# Patient Record
Sex: Female | Born: 1975 | Race: White | Hispanic: No | Marital: Married | Smoking: Never smoker
Health system: Southern US, Community
[De-identification: ages and names within clinical notes are randomized; demographics above are authoritative.]

## PROBLEM LIST (undated history)

## (undated) DIAGNOSIS — F429 Obsessive-compulsive disorder, unspecified: Secondary | ICD-10-CM

## (undated) DIAGNOSIS — R0602 Shortness of breath: Secondary | ICD-10-CM

## (undated) DIAGNOSIS — F32A Depression, unspecified: Secondary | ICD-10-CM

## (undated) DIAGNOSIS — M25471 Effusion, right ankle: Secondary | ICD-10-CM

## (undated) DIAGNOSIS — J1282 Pneumonia due to coronavirus disease 2019: Secondary | ICD-10-CM

## (undated) DIAGNOSIS — E559 Vitamin D deficiency, unspecified: Secondary | ICD-10-CM

## (undated) DIAGNOSIS — M25579 Pain in unspecified ankle and joints of unspecified foot: Secondary | ICD-10-CM

## (undated) DIAGNOSIS — F419 Anxiety disorder, unspecified: Secondary | ICD-10-CM

## (undated) DIAGNOSIS — R42 Dizziness and giddiness: Secondary | ICD-10-CM

## (undated) DIAGNOSIS — R5383 Other fatigue: Secondary | ICD-10-CM

## (undated) DIAGNOSIS — T79A0XA Compartment syndrome, unspecified, initial encounter: Secondary | ICD-10-CM

## (undated) DIAGNOSIS — U071 COVID-19: Secondary | ICD-10-CM

## (undated) DIAGNOSIS — K219 Gastro-esophageal reflux disease without esophagitis: Secondary | ICD-10-CM

## (undated) DIAGNOSIS — M25472 Effusion, left ankle: Secondary | ICD-10-CM

## (undated) DIAGNOSIS — M25569 Pain in unspecified knee: Secondary | ICD-10-CM

## (undated) DIAGNOSIS — I10 Essential (primary) hypertension: Secondary | ICD-10-CM

## (undated) DIAGNOSIS — M25475 Effusion, left foot: Secondary | ICD-10-CM

## (undated) HISTORY — DX: COVID-19: U07.1

## (undated) HISTORY — DX: Effusion, right ankle: M25.471

## (undated) HISTORY — DX: Dizziness and giddiness: R42

## (undated) HISTORY — DX: Other fatigue: R53.83

## (undated) HISTORY — DX: Effusion, left ankle: M25.472

## (undated) HISTORY — DX: Pain in unspecified knee: M25.569

## (undated) HISTORY — DX: Gastro-esophageal reflux disease without esophagitis: K21.9

## (undated) HISTORY — DX: Depression, unspecified: F32.A

## (undated) HISTORY — DX: Obsessive-compulsive disorder, unspecified: F42.9

## (undated) HISTORY — DX: Anxiety disorder, unspecified: F41.9

## (undated) HISTORY — DX: Shortness of breath: R06.02

## (undated) HISTORY — DX: Pneumonia due to coronavirus disease 2019: J12.82

## (undated) HISTORY — DX: Vitamin D deficiency, unspecified: E55.9

## (undated) HISTORY — DX: Essential (primary) hypertension: I10

## (undated) HISTORY — DX: Pain in unspecified ankle and joints of unspecified foot: M25.579

## (undated) HISTORY — DX: Compartment syndrome, unspecified, initial encounter: T79.A0XA

## (undated) HISTORY — DX: Effusion, left foot: M25.475

---

## 1997-04-12 ENCOUNTER — Inpatient Hospital Stay (HOSPITAL_COMMUNITY): Admission: AD | Admit: 1997-04-12 | Discharge: 1997-04-12 | Payer: Self-pay | Admitting: Gynecology

## 1997-04-24 ENCOUNTER — Inpatient Hospital Stay (HOSPITAL_COMMUNITY): Admission: AD | Admit: 1997-04-24 | Discharge: 1997-04-27 | Payer: Self-pay | Admitting: Gynecology

## 1997-06-04 ENCOUNTER — Other Ambulatory Visit: Admission: RE | Admit: 1997-06-04 | Discharge: 1997-06-04 | Payer: Self-pay | Admitting: Gynecology

## 1998-09-06 HISTORY — PX: DILATION AND CURETTAGE OF UTERUS: SHX78

## 1998-09-25 ENCOUNTER — Other Ambulatory Visit: Admission: RE | Admit: 1998-09-25 | Discharge: 1998-09-25 | Payer: Self-pay | Admitting: Obstetrics and Gynecology

## 1998-09-27 ENCOUNTER — Encounter (INDEPENDENT_AMBULATORY_CARE_PROVIDER_SITE_OTHER): Payer: Self-pay

## 1998-09-27 ENCOUNTER — Ambulatory Visit (HOSPITAL_COMMUNITY): Admission: RE | Admit: 1998-09-27 | Discharge: 1998-09-27 | Payer: Self-pay | Admitting: Obstetrics and Gynecology

## 2001-09-19 ENCOUNTER — Other Ambulatory Visit: Admission: RE | Admit: 2001-09-19 | Discharge: 2001-09-19 | Payer: Self-pay | Admitting: Obstetrics and Gynecology

## 2002-03-23 ENCOUNTER — Inpatient Hospital Stay (HOSPITAL_COMMUNITY): Admission: AD | Admit: 2002-03-23 | Discharge: 2002-03-26 | Payer: Self-pay | Admitting: *Deleted

## 2002-03-24 ENCOUNTER — Encounter (INDEPENDENT_AMBULATORY_CARE_PROVIDER_SITE_OTHER): Payer: Self-pay | Admitting: *Deleted

## 2010-02-05 ENCOUNTER — Encounter: Admission: RE | Admit: 2010-02-05 | Discharge: 2010-02-05 | Payer: Self-pay | Admitting: Obstetrics and Gynecology

## 2010-07-24 NOTE — Discharge Summary (Signed)
NAME:  [name], [name]                         ACCOUNT NO.:  [id number]   MEDICAL RECORD NO.:  [id number]                   PATIENT TYPE:  INP   LOCATION:  9108                                 FACILITY:  [facility]   PHYSICIAN:  Michelle L. Vincente Poli, M.D.            DATE OF BIRTH:  [birth year]   DATE OF ADMISSION:  03/24/2002  DATE OF DISCHARGE:  03/26/2002                                 DISCHARGE SUMMARY   ADMITTING DIAGNOSES:  1. Intrauterine pregnancy at term.  2. Previously scarred uterus, desires vaginal birth after cesarean.  3. Spontaneous rupture of membranes with blood stained fluid.   DISCHARGE DIAGNOSES:  1. Status post low transverse cesarean section.  2. Viable female infant.   PROCEDURE:  Repeat low transverse cesarean section.   REASON FOR ADMISSION:  Please see dictated H&P.   HOSPITAL COURSE:  The patient is a 35 year old white married female gravida  3, para 1 that was admitted to [facility] with spontaneous  rupture of membranes at 38 weeks estimated gestational age.  The patient had  a large amount of bright red bleeding associated with amniotic fluid.  The  patient had history of a previous cesarean section due to cephalopelvic  disproportion but patient desired and requested vaginal birth after  cesarean.  The fetal heart tones were reassuring.  However, she continued to  have multiple episodes of bright red bleeding.  Decision was made to proceed  with a cesarean delivery.  The patient was taken to the operating room where  spinal anesthesia was administered without difficulty.  A low transverse  incision was made with the delivery of a viable female infant weighing 6  pounds 8 ounces with Apgars of 8 at one minute, 9 at five minutes.  Small  placental abruption was noted.  The patient did tolerate the procedure well  and was taken to the recovery room in stable condition.  On postoperative  day one patient had good return of bowel function.  She was  ambulating  without assistance, tolerating a regular diet without complaints of nausea  and vomiting.  Laboratories revealed hemoglobin of 9.8, platelet count of  180,000, WBC count of 9.8.  On postoperative day two vital signs were  stable.  Abdomen was soft.  Abdominal dressing was removed revealing  incision that was clean, dry, and intact.  The patient desired early  discharge.  Instructions were given and she was discharged home.   CONDITION ON DISCHARGE:  Good.   DIET:  Regular, as tolerated.   ACTIVITY:  No heavy lifting.  No driving x2 weeks.  No vaginal entry.   FOLLOW UP:  She is to follow up in the office in three to four days for  staple removal.  Otherwise, she is to return back to the office in  approximately one week for an incision check.  She is to call for  temperature greater than 100 degrees,  persistent nausea and vomiting, heavy  vaginal bleeding, and/or redness or drainage from the incision site.   DISCHARGE MEDICATIONS:  1. Tylox numbers 30 one p.o. q.4-6h. p.r.n.  2.     Motrin 600 mg q.6h.  3. Prenatal vitamins one p.o. daily.  4. Colace one p.o. daily p.r.n.     Julio Sicks, N.P.                        Stann Mainland. Vincente Poli, M.D.    CC/MEDQ  D:  04/20/2002  T:  04/20/2002  Job:  [id number]

## 2010-07-24 NOTE — H&P (Signed)
   NAME:  [name], [name]                         ACCOUNT NO.:  [id number]   MEDICAL RECORD NO.:  [id number]                   PATIENT TYPE:  INP   LOCATION:  9169                                 FACILITY:  [facility]   PHYSICIAN:  Tracie Harrier, M.D.              DATE OF BIRTH:  05/30/1975   DATE OF ADMISSION:  03/23/2002  DATE OF DISCHARGE:                                HISTORY & PHYSICAL   HISTORY OF PRESENT ILLNESS:  Ms. Wickersham is a 35 year old female, Gravida III,  Para I, Ab 1 at [redacted] weeks gestation, admitted after spontaneous rupture of  membranes at approximately midnight. This spontaneous rupture of membranes  was associated with a large amount of blood. Fetal heart tones were  reassuring upon admission. She had a previous C-section in 1999 for fetal  macrosomia and requested a V-back. Her pregnancy otherwise went well. The  patient has a history of positive group B strep vaginal colonization.   OB LABORATORY DATA:  Maternal blood type A+, Rubella immune, positive group  B strep, Glucola normal.   PAST MEDICAL HISTORY:  1. History of poisonous snake bite.  2. History of hypertension, no current medications.   PAST SURGICAL HISTORY:  1. D&E in 2000.  2. C-section in 1999.   OBSTETRICAL HISTORY:  1. In 1999 a female weighing 8 pounds and 3 ounces for presumed macrosomia.  2. In 2000 SAB with D&E.   CURRENT MEDICATIONS:  Prenatal vitamins.   ALLERGIES:  No known drug allergies.   PHYSICAL EXAMINATION:  VITAL SIGNS: Stable. Blood pressure initially  elevated at 150/100, repeat 140/80, temperature 99.7. Fetal heart tones  140's to 150's and reassuring. No decelerations noted.  GENERAL: A well developed, well nourished  female in no acute distress.  HEENT: Within normal limits.  NECK: Supple without adenopathy or thyromegaly.  HEART: Clear.  LUNGS: Clear.  BREAST: Examination deferred.  ABDOMEN: Gravid and nontender.  EXTREMITIES: Normal.  NEURO: Grossly normal.  PELVIC:  Examination closed, thick, and high with vertex presentation.  Positive blood amniotic fluid is noted (nurse examiner).   ADMISSION DIAGNOSES:  1. Intrauterine pregnancy at 38 weeks.  2. Spontaneous rupture of membranes/bloody fluid.  3. Plans vaginal birth after Cesarean.  4. Positive group B strep vaginal colonization.   PLAN:  1. Admit.  2. Close observation.  3. Vaginal birth after Cesarean attempt.                                                 Tracie Harrier, M.D.    REG/MEDQ  D:  03/24/2002  T:  03/24/2002  Job:  [id number]

## 2010-07-24 NOTE — Op Note (Signed)
[name], [name]                         ACCOUNT NO.:  [id number]   MEDICAL RECORD NO.:  [id number]                   PATIENT TYPE:  INP   LOCATION:  9169                                 FACILITY:  [facility]   PHYSICIAN:  Tracie Harrier, M.D.              DATE OF BIRTH:  08/26/1975   DATE OF PROCEDURE:  03/24/2002  DATE OF DISCHARGE:                                 OPERATIVE REPORT   PREOPERATIVE DIAGNOSES:  1. Intrauterine pregnancy at term.  2. Repeat cesarean section.  3. Vaginal bleeding.   POSTOPERATIVE DIAGNOSES:  1. Intrauterine pregnancy at term.  2. Repeat cesarean section.  3. Vaginal bleeding.  4. Small placental abruption.   PROCEDURE:  Repeat low transverse cesarean section.   SURGEON:  Tracie Harrier, M.D.   ANESTHESIA:  Spinal.   ESTIMATED BLOOD LOSS:  800 cubic centimeters.   COMPLICATIONS:  None.   FINDINGS:  At 11 through a low transverse uterine incision, a viable female  infant was delivered from the vertex presentation.  Apgars were 8 and 9.   A small placental abruption was noted.   The pelvis was visualized at the time of surgery and noted to be normal.   INDICATIONS:  The patient is a 35 year old female who experienced  spontaneous rupture of membranes at 38 weeks at about midnight last night.  She had a large amount of bright blood associated with amniotic fluid.  She  requested VBAC, however.  She was admitted to labor and delivery, where she  was watched closely.  The patient did have multiple episodes of bright red  bleeding.  Fetal heart tones remained reassuring.  After thoughtful  consideration, the patient was taken for repeat cesarean section for  unexplained vaginal bleeding.  The risks, benefits, and need for this  surgery were explained to the patient.  Questions were answered.   DESCRIPTION OF PROCEDURE:  The patient was taken to the operating room,  where a spinal anesthetic was administered.  The patient was placed on  the  operating table in the left lateral tilt position, the abdomen was prepped  and draped in the usual sterile fashion with Betadine and sterile drapes.  A  Foley catheter was inserted.  The abdomen was entered through a Pfannenstiel  incision and carried down sharply in the usual fashion.  The peritoneum was  atraumatically entered.  The vesicouterine peritoneum overlying the lower  uterine segment was incised and the bladder flap was bluntly and sharply  created over the lower uterine segment.  A bladder blade was then placed  behind the bladder to ensure its protection during the procedure.  The  uterus was then entered through a low transverse incision and carried out  laterally using the operator's fingers.  The vertex was elevated into the  incision and delivered promptly and easily at 0659.  The oropharynx and  nasopharynx were thoroughly bulb-suctioned.  A nuchal  cord was reduced x1.  The vertex delivered easily.  The oropharynx and nasopharynx were bulb-  suctioned and the baby handed promptly to the pediatricians.  The baby did  well with strong, spontaneous cry.  Apgars were 8 and 9.  Weight is pending.   A small placental abruption was then noted.  Cord blood was obtained.  The  placenta was then manually extracted intact with three-vessel cord without  difficulty.  The anterior of the uterus was wiped clean thoroughly with a  wet sponge.  The uterine incision was then closed in a two-layer fashion,  the first layer a running interlocking suture of #1 Vicryl.  A second  imbricating suture was placed across the primary suture line with a running  suture of #1 Vicryl as well.  Good hemostasis was noted.  The pelvis was  thoroughly irrigated and noted to be hemostatic.  The pelvis was also noted  to be normal.   Hemostasis was noted and attention was then turned to closure.  The rectus  muscle and anterior peritoneum were closed with a running suture of #1  Vicryl.  The  subfascial areas were hemostatic. The fascia was then closed  with a running suture of #1 PDS.  The subcutaneous tissue was irrigated and  made hemostatic using the Bovie cautery.  The subcutaneous tissue was  reapproximated with multiple interrupted sutures of #1 Vicryl.  The skin  reapproximated with staples and a sterile dressing applied.   Final sponge, needle, and instrument count was correct x3.  There were no  perioperative complications.  The baby did well.  The patient did receive an  antibiotic postoperatively.                                               Tracie Harrier, M.D.    REG/MEDQ  D:  03/24/2002  T:  03/25/2002  Job:  [id number]

## 2018-04-19 ENCOUNTER — Other Ambulatory Visit: Payer: Self-pay | Admitting: Obstetrics and Gynecology

## 2018-04-19 DIAGNOSIS — R928 Other abnormal and inconclusive findings on diagnostic imaging of breast: Secondary | ICD-10-CM

## 2018-04-26 ENCOUNTER — Ambulatory Visit
Admission: RE | Admit: 2018-04-26 | Discharge: 2018-04-26 | Disposition: A | Discharge status: Home / Self Care | Payer: 59 | Source: Ambulatory Visit | Attending: Obstetrics and Gynecology | Admitting: Obstetrics and Gynecology

## 2018-04-26 ENCOUNTER — Ambulatory Visit: Payer: Self-pay

## 2018-04-26 DIAGNOSIS — R928 Other abnormal and inconclusive findings on diagnostic imaging of breast: Secondary | ICD-10-CM

## 2018-07-26 DIAGNOSIS — I10 Essential (primary) hypertension: Secondary | ICD-10-CM | POA: Insufficient documentation

## 2018-07-26 DIAGNOSIS — E559 Vitamin D deficiency, unspecified: Secondary | ICD-10-CM | POA: Insufficient documentation

## 2018-07-26 DIAGNOSIS — F422 Mixed obsessional thoughts and acts: Secondary | ICD-10-CM | POA: Insufficient documentation

## 2018-07-26 DIAGNOSIS — F419 Anxiety disorder, unspecified: Secondary | ICD-10-CM | POA: Insufficient documentation

## 2018-08-09 ENCOUNTER — Encounter: Payer: Self-pay | Admitting: Internal Medicine

## 2020-03-10 DIAGNOSIS — R7989 Other specified abnormal findings of blood chemistry: Secondary | ICD-10-CM | POA: Insufficient documentation

## 2020-03-25 DIAGNOSIS — B372 Candidiasis of skin and nail: Secondary | ICD-10-CM | POA: Insufficient documentation

## 2020-07-09 IMAGING — MG DIGITAL DIAGNOSTIC UNILATERAL LEFT MAMMOGRAM WITH TOMO AND CAD
8 series · 8 of 24 positions shown · non-contrast
Comparison: Previous exam(s).

CLINICAL DATA: Screening recall for a left breast asymmetry.

EXAM:
DIGITAL DIAGNOSTIC UNILATERAL LEFT MAMMOGRAM WITH CAD AND TOMO

[L CC synth-2D (1 of 2)]
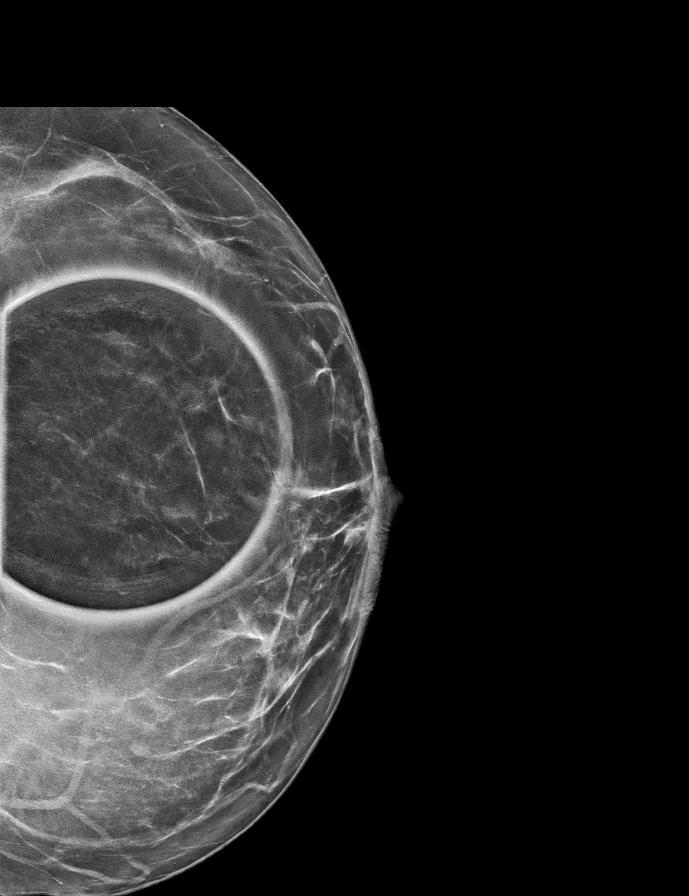

[L ML synth-2D]
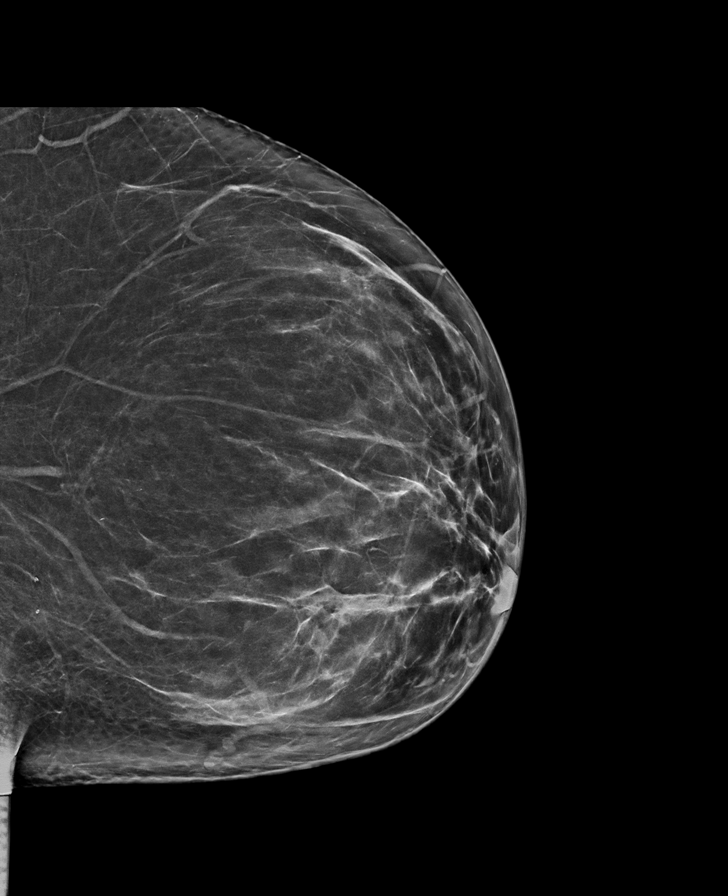

[L MLO synth-2D]
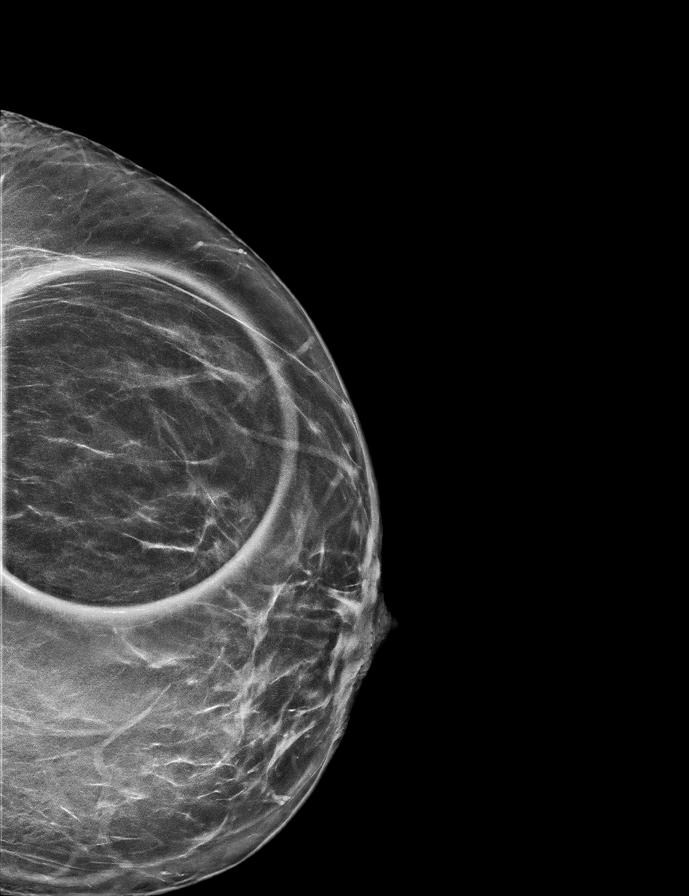

[L CC synth-2D (2 of 2)]
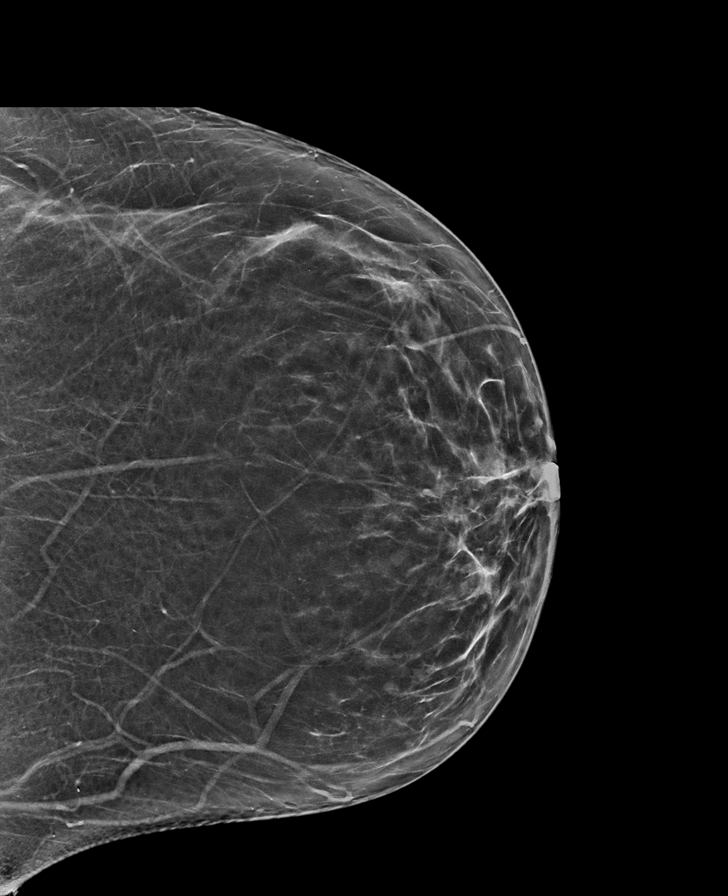

[L CC tomo (1 of 2) · tomo slice 38/75.0]
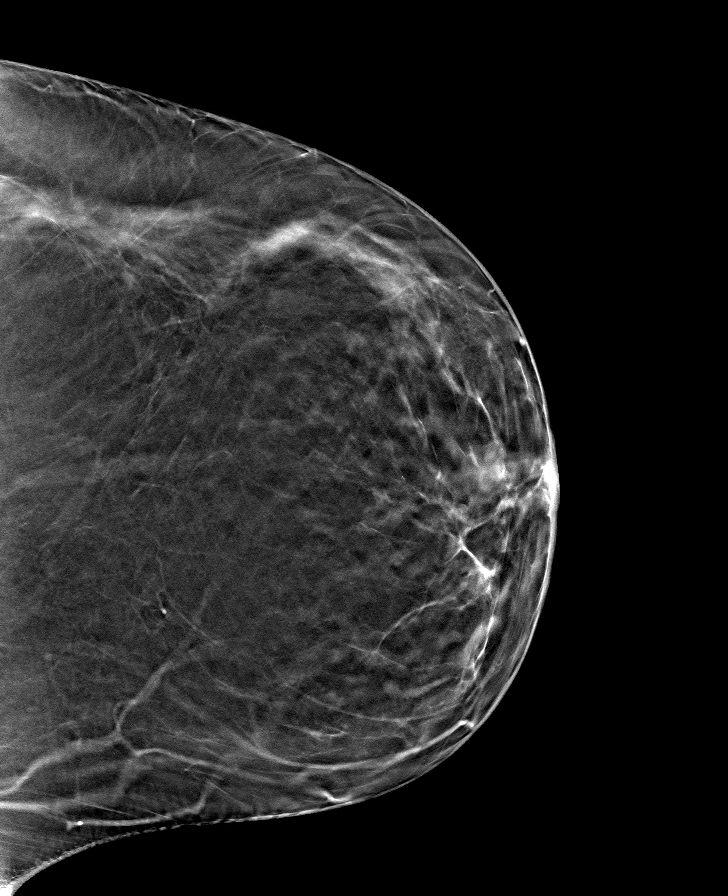

[L CC tomo (2 of 2) · tomo slice 31/62.0]
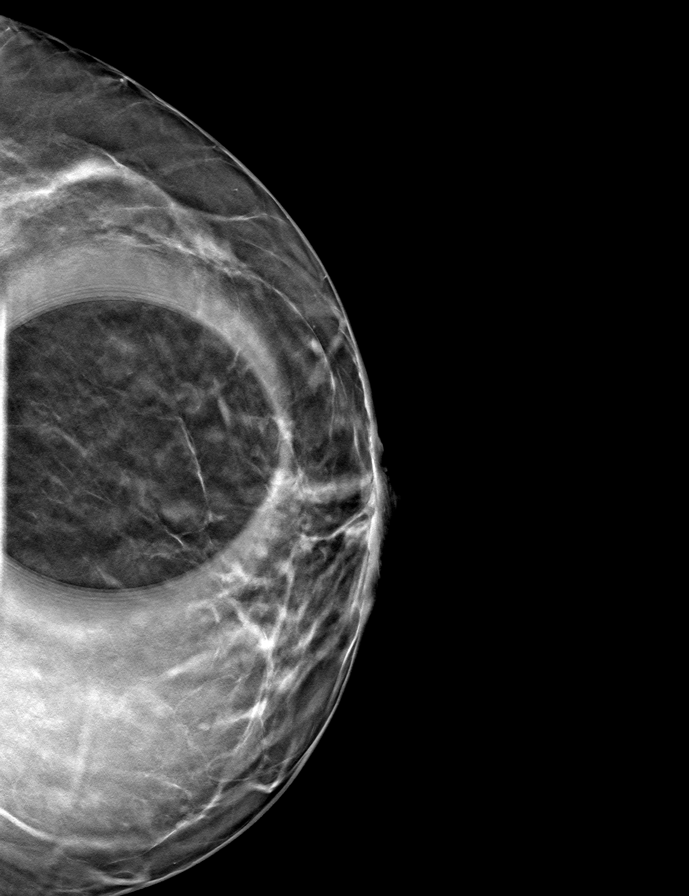

[L MLO tomo · tomo slice 37/74.0]
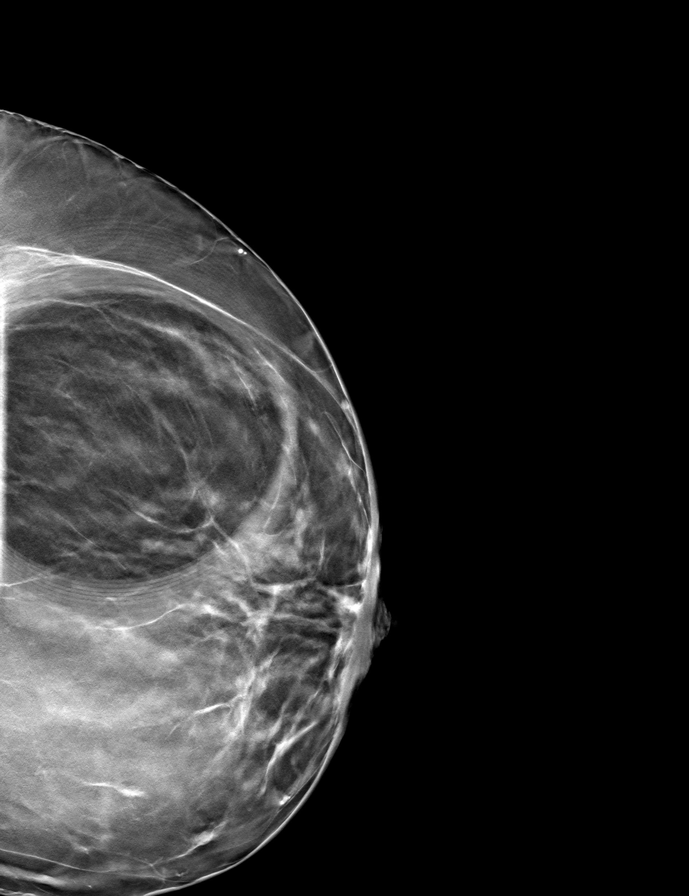

[L ML tomo · tomo slice 37/74.0]
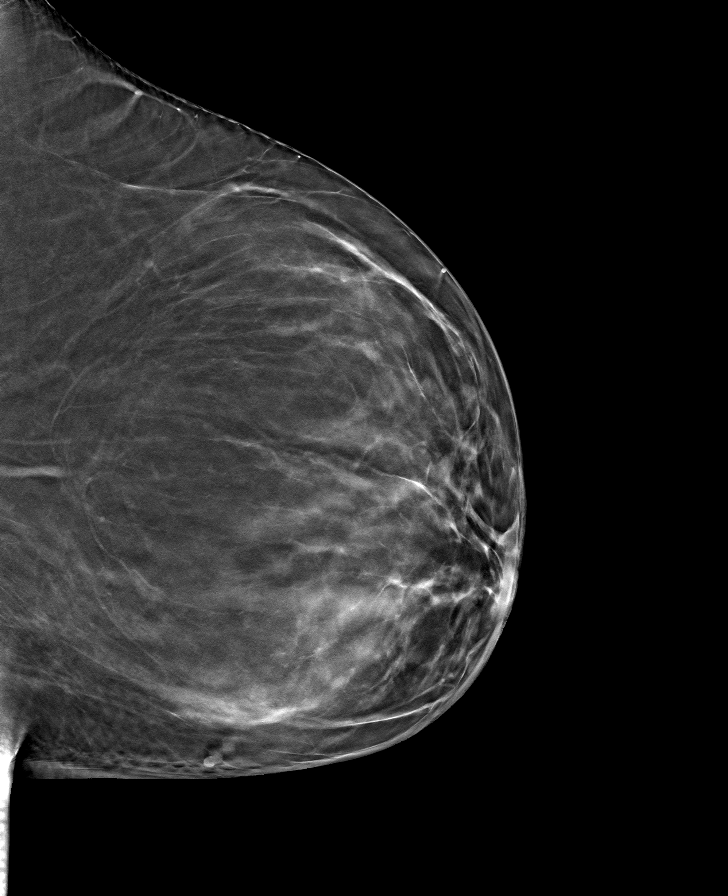

[8 of 24 positions shown; findings below may reference images not displayed]

ACR Breast Density Category c: The breast tissue is heterogeneously
dense, which may obscure small masses.
FINDINGS: The small asymmetry in the lateral aspect of the left breast
resolves on spot compression tomosynthesis imaging, as well as a
repeat CC view. No suspicious calcifications, masses or areas of
distortion are seen in the left breast.

Mammographic images were processed with CAD.
IMPRESSION: Resolution of the left breast asymmetry consistent with overlapping
fibroglandular tissue.

RECOMMENDATION:
Screening mammogram in one year.(Code:Q8-5-39M)

I have discussed the findings and recommendations with the patient.
Results were also provided in writing at the conclusion of the
visit. If applicable, a reminder letter will be sent to the patient
regarding the next appointment.

BI-RADS CATEGORY  1: Negative.

## 2020-12-03 LAB — BASIC METABOLIC PANEL
BUN: 17 (ref 4–21)
CO2: 27 — AB (ref 13–22)
Chloride: 106 (ref 99–108)
Creatinine: 0.8 (ref <=1.1)
Glucose: 86
Potassium: 5.2 (ref 3.4–5.3)
Sodium: 144 (ref 137–147)

## 2020-12-03 LAB — HEPATIC FUNCTION PANEL
Alkaline Phosphatase: 95 (ref 25–125)
Bilirubin, Total: 0.2

## 2020-12-03 LAB — CBC AND DIFFERENTIAL
HCT: 44 (ref 36–46)
Hemoglobin: 14.6 (ref 12.0–16.0)
Platelets: 281 (ref 150–399)
WBC: 7.6

## 2020-12-03 LAB — COMPREHENSIVE METABOLIC PANEL: Albumin: 4.3 (ref 3.5–5.0)

## 2020-12-03 LAB — HEMOGLOBIN A1C: Hemoglobin A1C: 5.7

## 2020-12-03 LAB — CBC: RBC: 5.42 — AB (ref 3.87–5.11)

## 2020-12-03 LAB — LIPID PANEL
Cholesterol: 218 — AB (ref 0–200)
HDL: 67 (ref 35–70)
LDL Cholesterol: 136
LDl/HDL Ratio: 2

## 2020-12-03 LAB — VITAMIN D 25 HYDROXY (VIT D DEFICIENCY, FRACTURES): Vit D, 25-Hydroxy: 28.9

## 2020-12-03 LAB — TSH: TSH: 1.51 (ref <=5.90)

## 2020-12-16 DIAGNOSIS — Z0289 Encounter for other administrative examinations: Secondary | ICD-10-CM

## 2020-12-18 ENCOUNTER — Ambulatory Visit (INDEPENDENT_AMBULATORY_CARE_PROVIDER_SITE_OTHER): Payer: 59 | Admitting: Bariatrics

## 2021-01-01 ENCOUNTER — Ambulatory Visit (INDEPENDENT_AMBULATORY_CARE_PROVIDER_SITE_OTHER): Payer: Self-pay | Admitting: Bariatrics

## 2021-01-13 ENCOUNTER — Encounter (INDEPENDENT_AMBULATORY_CARE_PROVIDER_SITE_OTHER): Payer: Self-pay | Admitting: Family Medicine

## 2021-01-13 ENCOUNTER — Other Ambulatory Visit: Payer: Self-pay

## 2021-01-13 ENCOUNTER — Ambulatory Visit (INDEPENDENT_AMBULATORY_CARE_PROVIDER_SITE_OTHER): Payer: 59 | Admitting: Family Medicine

## 2021-01-13 VITALS — BP 137/86 | HR 85 | Temp 98.2°F | Ht 64.0 in | Wt 304.0 lb

## 2021-01-13 DIAGNOSIS — Z1331 Encounter for screening for depression: Secondary | ICD-10-CM

## 2021-01-13 DIAGNOSIS — R0602 Shortness of breath: Secondary | ICD-10-CM

## 2021-01-13 DIAGNOSIS — I10 Essential (primary) hypertension: Secondary | ICD-10-CM

## 2021-01-13 DIAGNOSIS — Z9189 Other specified personal risk factors, not elsewhere classified: Secondary | ICD-10-CM

## 2021-01-13 DIAGNOSIS — Z6841 Body Mass Index (BMI) 40.0 and over, adult: Secondary | ICD-10-CM

## 2021-01-13 DIAGNOSIS — E559 Vitamin D deficiency, unspecified: Secondary | ICD-10-CM | POA: Diagnosis not present

## 2021-01-13 DIAGNOSIS — F39 Unspecified mood [affective] disorder: Secondary | ICD-10-CM

## 2021-01-13 DIAGNOSIS — R5383 Other fatigue: Secondary | ICD-10-CM | POA: Diagnosis not present

## 2021-01-14 LAB — COMPREHENSIVE METABOLIC PANEL
ALT: 19 IU/L (ref 0–32)
AST: 18 IU/L (ref 0–40)
Albumin/Globulin Ratio: 1.4 (ref 1.2–2.2)
Albumin: 4.3 g/dL (ref 3.8–4.8)
Alkaline Phosphatase: 115 IU/L (ref 44–121)
BUN/Creatinine Ratio: 19 (ref 9–23)
BUN: 15 mg/dL (ref 6–24)
Bilirubin Total: 0.3 mg/dL (ref 0.0–1.2)
CO2: 16 mmol/L — ABNORMAL LOW (ref 20–29)
Calcium: 9.1 mg/dL (ref 8.7–10.2)
Chloride: 101 mmol/L (ref 96–106)
Creatinine, Ser: 0.79 mg/dL (ref 0.57–1.00)
Globulin, Total: 3.1 g/dL (ref 1.5–4.5)
Glucose: 74 mg/dL (ref 70–99)
Potassium: 4.6 mmol/L (ref 3.5–5.2)
Sodium: 138 mmol/L (ref 134–144)
Total Protein: 7.4 g/dL (ref 6.0–8.5)
eGFR: 93 mL/min/{1.73_m2} (ref >59)

## 2021-01-14 LAB — CBC WITH DIFFERENTIAL/PLATELET
Basophils Absolute: 0.1 10*3/uL (ref 0.0–0.2)
Basos: 1 %
EOS (ABSOLUTE): 0.1 10*3/uL (ref 0.0–0.4)
Eos: 1 %
Hematocrit: 44.5 % (ref 34.0–46.6)
Hemoglobin: 14.4 g/dL (ref 11.1–15.9)
Immature Grans (Abs): 0 10*3/uL (ref 0.0–0.1)
Immature Granulocytes: 0 %
Lymphocytes Absolute: 2.7 10*3/uL (ref 0.7–3.1)
Lymphs: 23 %
MCH: 26.6 pg (ref 26.6–33.0)
MCHC: 32.4 g/dL (ref 31.5–35.7)
MCV: 82 fL (ref 79–97)
Monocytes Absolute: 0.6 10*3/uL (ref 0.1–0.9)
Monocytes: 5 %
Neutrophils Absolute: 8.4 10*3/uL — ABNORMAL HIGH (ref 1.4–7.0)
Neutrophils: 70 %
Platelets: 339 10*3/uL (ref 150–450)
RBC: 5.42 x10E6/uL — ABNORMAL HIGH (ref 3.77–5.28)
RDW: 13.9 % (ref 11.7–15.4)
WBC: 12 10*3/uL — ABNORMAL HIGH (ref 3.4–10.8)

## 2021-01-14 LAB — LIPID PANEL WITH LDL/HDL RATIO
Cholesterol, Total: 206 mg/dL — ABNORMAL HIGH (ref 100–199)
HDL: 59 mg/dL (ref >39)
LDL Chol Calc (NIH): 134 mg/dL — ABNORMAL HIGH (ref 0–99)
LDL/HDL Ratio: 2.3 ratio (ref 0.0–3.2)
Triglycerides: 72 mg/dL (ref 0–149)
VLDL Cholesterol Cal: 13 mg/dL (ref 5–40)

## 2021-01-14 LAB — T4, FREE: Free T4: 1.28 ng/dL (ref 0.82–1.77)

## 2021-01-14 LAB — FOLATE: Folate: 6.4 ng/mL (ref >3.0)

## 2021-01-14 LAB — INSULIN, RANDOM: INSULIN: 43.9 u[IU]/mL — ABNORMAL HIGH (ref 2.6–24.9)

## 2021-01-14 LAB — TSH: TSH: 1.54 u[IU]/mL (ref 0.450–4.500)

## 2021-01-14 LAB — HEMOGLOBIN A1C
Est. average glucose Bld gHb Est-mCnc: 114 mg/dL
Hgb A1c MFr Bld: 5.6 % (ref 4.8–5.6)

## 2021-01-14 LAB — VITAMIN B12: Vitamin B-12: 538 pg/mL (ref 232–1245)

## 2021-01-14 LAB — VITAMIN D 25 HYDROXY (VIT D DEFICIENCY, FRACTURES): Vit D, 25-Hydroxy: 25.8 ng/mL — ABNORMAL LOW (ref 30.0–100.0)

## 2021-01-14 NOTE — Progress Notes (Signed)
Chief Complaint:   OBESITY [name] (MR# [id number]) is a 45 y.o. female who presents for evaluation and treatment of obesity and related comorbidities. Current BMI is Body mass index is 52.18 kg/m. [name] has been struggling with her weight for many years and has been unsuccessful in either losing weight, maintaining weight loss, or reaching her healthy weight goal.  [name] lives with her husband and 2 sons, ages 83 and 64 years old. 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 She craves pasta, carbohydrates, "junk food", and chocolate. She skips breakfast every day, and she notes her worst habit is "junk food". She drinks caloric beverages (2 cans of sodas per day on an average).  [name] is currently in the action stage of change and ready to dedicate time achieving and maintaining a healthier weight. [name] is interested in becoming our patient and working on intensive lifestyle modifications including (but not limited to) diet and exercise for weight loss.  [name]'s habits were reviewed today and are as follows: Her family eats meals together, she thinks her family will eat healthier with her, her desired weight loss is 154 lbs, she has been heavy most of her life, she started gaining weight , as she has always been heavy, her heaviest weight ever was 300 pounds, she is a picky eater and doesn't like to eat healthier foods, she has significant food cravings issues, she skips meals frequently, she is frequently drinking liquids with calories, she frequently makes poor food choices, she frequently eats larger portions than normal, she has binge eating behaviors, and she struggles with emotional eating.  Depression Screen [name]'s Food and Mood (modified PHQ-9) score was 20.  Depression screen PHQ 2/9 01/13/2021  Decreased Interest 3  Down, Depressed, Hopeless 3  PHQ - 2 Score 6  Altered sleeping 3  Tired, decreased energy 3  Change in appetite 3  Feeling bad  or failure about yourself  3  Trouble concentrating 2  Moving slowly or fidgety/restless 0  Suicidal thoughts 0  PHQ-9 Score 20  Difficult doing work/chores Somewhat difficult   Subjective:   1. Other fatigue [name] admits to daytime somnolence and admits to waking up still tired. Patent has a history of symptoms of daytime fatigue and morning fatigue. [name] generally gets 8 hours of sleep per night, and states that she has nightime awakenings. Snoring is present. Apneic episodes are present. Epworth Sleepiness Score is 3. 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 She has been short of breath, fatigued, and dizzy since.  2. Shortness of breath on exertion [name] notes increasing shortness of breath with exercising and seems to be worsening over time with weight gain. She notes getting out of breath sooner with activity than she used to. This has not gotten worse recently. [name] denies shortness of breath at rest or orthopnea.  3. Essential hypertension [name] is taking Lotrel. She denies dizziness, headache, or vision changes. She has no issues with her medications or blood pressure.   4. Vitamin D deficiency [name] was on OTC Vit D 5,000 units q daily, but she is not currently taking this. She was recently told that she is low in Vit D.  5. Mood disorder (HCC) with emotional eating [name] has anxiety and OCD. She is taking Zoloft. Her PHQ-9 score is 20. She has a seen a counselor in the past for OCD, but has not seen anyone recently. She denies depression, although her PHQ-9 score  is high.   6. At risk for diabetes mellitus [name] is at higher than average risk for developing diabetes due to obesity.   Assessment/Plan:   Orders Placed This Encounter  Procedures   Vitamin B12   CBC with Differential/Platelet   Comprehensive metabolic panel   Folate   Hemoglobin A1c   Insulin, random   Lipid Panel With LDL/HDL Ratio   T4, free   TSH    VITAMIN D 25 Hydroxy (Vit-D Deficiency, Fractures)   EKG 12-Lead    There are no discontinued medications.   No orders of the defined types were placed in this encounter.    1. Other fatigue [name] does feel that her weight is causing her energy to be lower than it should be. Fatigue may be related to obesity, depression or many other causes. Labs will be ordered, and in the meanwhile, Latrish will focus on self care including making healthy food choices, increasing physical activity and focusing on stress reduction.  - Vitamin B12 - CBC with Differential/Platelet - EKG 12-Lead - Folate - Hemoglobin A1c - Insulin, random - T4, free - TSH  2. Shortness of breath on exertion Minna does feel that she gets out of breath more easily that she used to when she exercises. [place]'s shortness of breath appears to be obesity related and exercise induced. She has agreed to work on weight loss and gradually increase exercise to treat her exercise induced shortness of breath. Will continue to monitor closely.  3. Essential hypertension We will check labs today. [name]'s blood pressure is not optimally controlled. Her primary care physician recently told her to change to Norvasc 10 mg and Valsartan 160 mg, and discontinue Lotrel, and I recommend that as well. [name] is to continue with at home blood pressure monitoring.   - CBC with Differential/Platelet - Hemoglobin A1c - Insulin, random - Lipid Panel With LDL/HDL Ratio - T4, free - TSH - Comprehensive metabolic panel  4. Vitamin D deficiency Low Vitamin D level contributes to fatigue and are associated with obesity, breast, and colon cancer. We will check labs today and [name] will follow-up for routine testing of Vitamin D, at least 2-3 times per year to avoid over-replacement.  - VITAMIN D 25 Hydroxy (Vit-D Deficiency, Fractures)  5. Mood disorder (HCC) with emotional eating Behavior modification techniques were discussed today  to help [name] deal with her emotional/non-hunger eating behaviors. We will refer [name] to Dr. Dewaine Conger, our Bariatric Psychologist for evaluation. She will continue her medications for OCD per her primary care physician. She understands that Dr. Dewaine Conger may refer her to an outside counselor as well for OCD. Orders and follow up as documented in patient record.   6. Screening for depression Shirl had a positive depression screening. Depression is commonly associated with obesity and often results in emotional eating behaviors. We will monitor this closely and work on CBT to help improve the non-hunger eating patterns. Referral to Psychology may be required if no improvement is seen as she continues in our clinic.  7. At risk for diabetes mellitus Jimma was given approximately 18 minutes of diabetes education and counseling today. We discussed intensive lifestyle modifications today with an emphasis on weight loss as well as increasing exercise and decreasing simple carbohydrates in her diet. We also reviewed medication options with an emphasis on risk versus benefit of those discussed.   Repetitive spaced learning was employed today to elicit superior memory formation and behavioral change.  8. Class 3 severe obesity with  serious comorbidity and body mass index (BMI) of 50.0 to 59.9 in adult, unspecified obesity type (HCC) [name] is currently in the action stage of change and her goal is to continue with weight loss efforts. I recommend [name] begin the structured treatment plan as follows:  She has agreed to the Category 3 Plan.  [name] is hesitant about the plan, and she will focus on eating 1 meals per day on the plan.  Exercise goals: As is.   Behavioral modification strategies: decreasing liquid calories and avoiding temptations.  She was informed of the importance of frequent follow-up visits to maximize her success with intensive lifestyle modifications for her multiple health  conditions. She was informed we would discuss her lab results at her next visit unless there is a critical issue that needs to be addressed sooner. [name] agreed to keep her next visit at the agreed upon time to discuss these results.  Objective:   Blood pressure 137/86, pulse 85, temperature 98.2 F (36.8 C), height 5\' 4"  (1.626 m), weight (!) 304 lb (137.9 kg), SpO2 96 %. Body mass index is 52.18 kg/m.  EKG: Normal sinus rhythm, rate 80 BPM.  Indirect Calorimeter completed today shows a VO2 of 364 and a REE of 2520.  Her calculated basal metabolic rate is thus her basal metabolic rate is better than expected.  General: Cooperative, alert, well developed, in no acute distress. HEENT: Conjunctivae and lids unremarkable. Cardiovascular: Regular rhythm.  Lungs: Normal work of breathing. Neurologic: No focal deficits.   No results found for: CREATININE, BUN, NA, K, CL, CO2 No results found for: ALT, AST, GGT, ALKPHOS, BILITOT No results found for: HGBA1C No results found for: INSULIN No results found for: TSH No results found for: CHOL, HDL, LDLCALC, LDLDIRECT, TRIG, CHOLHDL No results found for: WBC, HGB, HCT, MCV, PLT No results found for: IRON, TIBC, FERRITIN  Attestation Statements:   Reviewed by clinician on day of visit: allergies, medications, problem list, medical history, surgical history, family history, social history, and previous encounter notes.   [id number], am acting as transcriptionist for Trude Mcburney, DO.  I have reviewed the above documentation for accuracy and completeness, and I agree with the above. [employer], D.O.  The 21st Century Cures Act was signed into law in 2016 which includes the topic of electronic health records.  This provides immediate access to information in MyChart.  This includes consultation notes, operative notes, office notes, lab results and pathology reports.  If you have any questions about what you read please  let 2017 know at your next visit so we can discuss your concerns and take corrective action if need be.  We are right here with you.

## 2021-01-15 ENCOUNTER — Ambulatory Visit (INDEPENDENT_AMBULATORY_CARE_PROVIDER_SITE_OTHER): Payer: Self-pay | Admitting: Family Medicine

## 2021-01-15 ENCOUNTER — Encounter (INDEPENDENT_AMBULATORY_CARE_PROVIDER_SITE_OTHER): Payer: Self-pay | Admitting: Family Medicine

## 2021-01-20 ENCOUNTER — Encounter (INDEPENDENT_AMBULATORY_CARE_PROVIDER_SITE_OTHER): Payer: Self-pay

## 2021-01-27 ENCOUNTER — Encounter (INDEPENDENT_AMBULATORY_CARE_PROVIDER_SITE_OTHER): Payer: Self-pay | Admitting: Bariatrics

## 2021-01-27 ENCOUNTER — Other Ambulatory Visit: Payer: Self-pay

## 2021-01-27 ENCOUNTER — Ambulatory Visit (INDEPENDENT_AMBULATORY_CARE_PROVIDER_SITE_OTHER): Payer: 59 | Admitting: Bariatrics

## 2021-01-27 VITALS — BP 135/86 | HR 104 | Temp 98.1°F | Ht 64.0 in | Wt 302.0 lb

## 2021-01-27 DIAGNOSIS — I10 Essential (primary) hypertension: Secondary | ICD-10-CM | POA: Diagnosis not present

## 2021-01-27 DIAGNOSIS — F5089 Other specified eating disorder: Secondary | ICD-10-CM | POA: Diagnosis not present

## 2021-01-27 DIAGNOSIS — E559 Vitamin D deficiency, unspecified: Secondary | ICD-10-CM

## 2021-01-27 DIAGNOSIS — E8881 Metabolic syndrome: Secondary | ICD-10-CM

## 2021-01-27 MED ORDER — BUPROPION HCL ER (SR) 150 MG PO TB12: 150.0000 mg | ORAL_TABLET | Freq: Every day | ORAL | 0 refills | Status: DC

## 2021-01-27 MED ORDER — VITAMIN D (ERGOCALCIFEROL) 1.25 MG (50000 UNIT) PO CAPS: 50000.0000 [IU] | ORAL_CAPSULE | ORAL | 0 refills | Status: DC

## 2021-01-27 NOTE — Progress Notes (Addendum)
Chief Complaint:   OBESITY [name] is here to discuss her progress with her obesity treatment plan along with follow-up of her obesity related diagnoses. [name] is on the Category 3 Plan and states she is following her eating plan approximately 30% of the time. [name] states she is doing 0 minutes 0 times per week.  Today's visit was #: 2 Starting weight: 304 lbs Starting date: 01/13/2021 Today's weight: 302 lbs Today's date: 01/27/2021 Total lbs lost to date: 2 lbs Total lbs lost since last in-office visit: 2 lbs  Interim History: [name] is down 2 lbs for her first visit. She did  not do well with the plan.  Subjective:   1. Vitamin D insufficiency [name] is currently not on medications. Her last Vitamin D level was 28.8.  2. Insulin resistance [name] is currently not on medications. Her last Insulin level 43.9. Her last A1C was 5.6.  3. Essential hypertension [name] was started on Lotrel at last visit. Her last blood pressure was 135/86.  4. Other disorder of eating [name] is struggling with stress emotional eating and using food for comfort to the extent that it is negatively impacting her health.    Assessment/Plan:   1. Vitamin D insufficiency Low Vitamin D level contributes to fatigue and are associated with obesity, breast, and colon cancer. We will refill prescription Vitamin D 50,000 IU every week for 1 month with no refills and Johnathon will follow-up for routine testing of Vitamin D, at least 2-3 times per year to avoid over-replacement.  - Vitamin D, Ergocalciferol, (DRISDOL) 1.25 MG (50000 UNIT) CAPS capsule; Take 1 capsule (50,000 Units total) by mouth every 7 (seven) days.  Dispense: 4 capsule; Refill: 0  2. Insulin resistance [name] will continue to work on weight loss, exercise, and decreasing simple carbohydrates to help decrease the risk of diabetes. She will increase healthy protein and fats. Handout on Insulin and Pre-diabetes were  provided.  3. Essential hypertension [name] will continue her blood pressure medication. She is working on healthy weight loss and exercise to improve blood pressure control. We will watch for signs of hypotension as she continues her lifestyle modifications.  4. Other disorder of eating Behavior modification techniques were discussed today to help [name] deal with her emotional/non-hunger eating behaviors.  We will refill bupropion 150 mg for 1 month with no refills. Orders and follow up as documented in patient record.    - buPROPion (WELLBUTRIN SR) 150 MG 12 hr tablet; Take 1 tablet (150 mg total) by mouth daily.  Dispense: 30 tablet; Refill: 0  [name] is currently in the action stage of change. As such, her goal is to continue with weight loss efforts. She has agreed to keeping a food journal and adhering to recommended goals of 1500 calories and 90 grams of protein.   [name] will continue meal planning. We reviewed labs from 01/13/2021 CMP, Lipid, Vitamin, CBC Insulin,TSH and T4. Strategies for the holidays were provided.  Exercise goals: No exercise has been prescribed at this time.  Behavioral modification strategies: increasing lean protein intake, decreasing simple carbohydrates, increasing vegetables, increasing water intake, decreasing eating out, no skipping meals, meal planning and cooking strategies, keeping healthy foods in the home, and planning for success.  [name] has agreed to follow-up with our clinic in 2 weeks with Dr. Sharee Holster. She was informed of the importance of frequent follow-up visits to maximize her success with intensive lifestyle modifications for her multiple health conditions.   Objective:   Blood  pressure 135/86, pulse (!) 104, temperature 98.1 F (36.7 C), height 5\' 4"  (1.626 m), weight (!) 302 lb (137 kg), SpO2 96 %. Body mass index is 51.84 kg/m.  General: Cooperative, alert, well developed, in no acute distress. HEENT: Conjunctivae and lids  unremarkable. Cardiovascular: Regular rhythm.  Lungs: Normal work of breathing. Neurologic: No focal deficits.   Lab Results  Component Value Date   CREATININE 0.79 01/13/2021   BUN 15 01/13/2021   NA 138 01/13/2021   K 4.6 01/13/2021   CL 101 01/13/2021   CO2 16 (L) 01/13/2021   Lab Results  Component Value Date   ALT 19 01/13/2021   AST 18 01/13/2021   ALKPHOS 115 01/13/2021   BILITOT 0.3 01/13/2021   Lab Results  Component Value Date   HGBA1C 5.6 01/13/2021   HGBA1C 5.7 12/03/2020   Lab Results  Component Value Date   INSULIN 43.9 (H) 01/13/2021   Lab Results  Component Value Date   TSH 1.540 01/13/2021   Lab Results  Component Value Date   CHOL 206 (H) 01/13/2021   HDL 59 01/13/2021   LDLCALC 134 (H) 01/13/2021   TRIG 72 01/13/2021   Lab Results  Component Value Date   VD25OH 25.8 (L) 01/13/2021   VD25OH 28.9 12/03/2020   Lab Results  Component Value Date   WBC 12.0 (H) 01/13/2021   HGB 14.4 01/13/2021   HCT 44.5 01/13/2021   MCV 82 01/13/2021   PLT 339 01/13/2021   No results found for: IRON, TIBC, FERRITIN  Attestation Statements:   Reviewed by clinician on day of visit: allergies, medications, problem list, medical history, surgical history, family history, social history, and previous encounter notes.  I, 13/10/2020, RMA, am acting as [employer] for Energy manager, DO.  [employer], DO

## 2021-02-02 ENCOUNTER — Encounter (INDEPENDENT_AMBULATORY_CARE_PROVIDER_SITE_OTHER): Payer: Self-pay | Admitting: Bariatrics

## 2021-02-10 ENCOUNTER — Ambulatory Visit (INDEPENDENT_AMBULATORY_CARE_PROVIDER_SITE_OTHER): Payer: Self-pay | Admitting: Family Medicine

## 2021-02-11 ENCOUNTER — Ambulatory Visit (INDEPENDENT_AMBULATORY_CARE_PROVIDER_SITE_OTHER): Payer: 59 | Admitting: Family Medicine

## 2021-02-11 ENCOUNTER — Encounter (INDEPENDENT_AMBULATORY_CARE_PROVIDER_SITE_OTHER): Payer: Self-pay | Admitting: Family Medicine

## 2021-02-11 ENCOUNTER — Other Ambulatory Visit: Payer: Self-pay

## 2021-02-11 VITALS — BP 137/84 | HR 93 | Temp 98.3°F | Ht 64.0 in | Wt 304.0 lb

## 2021-02-11 DIAGNOSIS — E7800 Pure hypercholesterolemia, unspecified: Secondary | ICD-10-CM

## 2021-02-11 DIAGNOSIS — R7301 Impaired fasting glucose: Secondary | ICD-10-CM

## 2021-02-11 DIAGNOSIS — I10 Essential (primary) hypertension: Secondary | ICD-10-CM | POA: Diagnosis not present

## 2021-02-11 DIAGNOSIS — E559 Vitamin D deficiency, unspecified: Secondary | ICD-10-CM | POA: Diagnosis not present

## 2021-02-11 DIAGNOSIS — F5089 Other specified eating disorder: Secondary | ICD-10-CM

## 2021-02-11 DIAGNOSIS — Z6841 Body Mass Index (BMI) 40.0 and over, adult: Secondary | ICD-10-CM

## 2021-02-11 MED ORDER — VICTOZA 18 MG/3ML ~~LOC~~ SOPN: PEN_INJECTOR | SUBCUTANEOUS | 1 refills | Status: DC

## 2021-02-11 MED ORDER — BUPROPION HCL ER (SR) 150 MG PO TB12: 150.0000 mg | ORAL_TABLET | Freq: Every day | ORAL | 0 refills | Status: DC

## 2021-02-11 MED ORDER — VITAMIN D (ERGOCALCIFEROL) 1.25 MG (50000 UNIT) PO CAPS: 50000.0000 [IU] | ORAL_CAPSULE | ORAL | 0 refills | Status: DC

## 2021-02-11 NOTE — Progress Notes (Signed)
Chief Complaint:   OBESITY [name] is here to discuss her progress with her obesity treatment plan along with follow-up of her obesity related diagnoses. See Medical Weight Management Flowsheet for complete bioelectrical impedance results.  Today's visit was #: 3 Starting weight: 304 lbs Starting date: 01/13/2021 Weight change since last visit: +2 lbs Total lbs lost to date: 0  Nutrition Plan: Keeping a food journal and adhering to recommended goals of 1500 calories and 90 grams of protein daily for 30% of the time. Activity: None.  Interim History:  Reviewed labs, first 2 visits.  High hunger cues.  Ozempic not covered by [facility].  Assessment/Plan:   1. Impaired fasting glucose Start Victoza 0.6 mg subcutaneously daily for 7 days, then increase to 1.2 mg subcutaneously daily, as per below.  - Start liraglutide (VICTOZA) 18 MG/3ML SOPN; Start 0.6mg  SQ once a day for 7 days, then increase to 1.2mg  once a day  Dispense: 6 mL; Refill: 1  2. Vitamin D insufficiency Not at goal. She is taking vitamin D 50,000 IU weekly.  Plan: Continue to take prescription Vitamin D @50 ,000 IU every week as prescribed.  Follow-up for routine testing of Vitamin D, at least 2-3 times per year to avoid over-replacement.  Lab Results  Component Value Date   VD25OH 25.8 (L) 01/13/2021   VD25OH 28.9 12/03/2020   - Refill Vitamin D, Ergocalciferol, (DRISDOL) 1.25 MG (50000 UNIT) CAPS capsule; Take 1 capsule (50,000 Units total) by mouth every 7 (seven) days.  Dispense: 4 capsule; Refill: 0  3. Pure hypercholesterolemia Course: Not at goal. Lipid-lowering medications: None.   Plan: Dietary changes: Increase soluble fiber, decrease simple carbohydrates, decrease saturated fat. Exercise changes: Moderate to vigorous-intensity aerobic activity 150 minutes per week or as tolerated. We will continue to monitor along with PCP/specialists as it pertains to her weight loss journey.  Lab Results  Component  Value Date   CHOL 206 (H) 01/13/2021   HDL 59 01/13/2021   LDLCALC 134 (H) 01/13/2021   TRIG 72 01/13/2021   Lab Results  Component Value Date   ALT 19 01/13/2021   AST 18 01/13/2021   ALKPHOS 115 01/13/2021   BILITOT 0.3 01/13/2021   The 10-year ASCVD risk score (Arnett DK, et al., 2019) is: 1.4%   Values used to calculate the score:     Age: 45 years     Sex: Female     Is Non-Hispanic African American: No     Diabetic: No     Tobacco smoker: No     Systolic Blood Pressure: 137 mmHg     Is BP treated: Yes     HDL Cholesterol: 59 mg/dL     Total Cholesterol: 206 mg/dL  4. Essential hypertension At goal. Medications: Norvasc 10 mg daily, valsartan 80 mg daily.   Plan: Avoid buying foods that are: processed, frozen, or prepackaged to avoid excess salt. We will watch for signs of hypotension as she continues lifestyle modifications.  BP Readings from Last 3 Encounters:  02/11/21 137/84  01/27/21 135/86  01/13/21 137/86   Lab Results  Component Value Date   CREATININE 0.79 01/13/2021   5. Other disorder of eating Behavior modification techniques were discussed today to help [name] deal with her emotional/non-hunger eating behaviors.  She is taking Wellbutrin 150 mg daily.  Will refill today, as per below.  - Refill buPROPion (WELLBUTRIN SR) 150 MG 12 hr tablet; Take 1 tablet (150 mg total) by mouth daily.  Dispense: 30  tablet; Refill: 0  6. Obesity, current BMI 52.2  Course: [name] is currently in the action stage of change. As such, her goal is to continue with weight loss efforts.   Nutrition goals: She has agreed to keeping a food journal and adhering to recommended goals of 1500 calories and 90 grams of protein.   Exercise goals: No exercise has been prescribed at this time.  Behavioral modification strategies: increasing lean protein intake, decreasing simple carbohydrates, and increasing vegetables.  [name] has agreed to follow-up with our clinic in 4  weeks. She was informed of the importance of frequent follow-up visits to maximize her success with intensive lifestyle modifications for her multiple health conditions.   Objective:   Blood pressure 137/84, pulse 93, temperature 98.3 F (36.8 C), temperature source Oral, height 5\' 4"  (1.626 m), weight (!) 304 lb (137.9 kg), SpO2 96 %. Body mass index is 52.18 kg/m.  General: Cooperative, alert, well developed, in no acute distress. HEENT: Conjunctivae and lids unremarkable. Cardiovascular: Regular rhythm.  Lungs: Normal work of breathing. Neurologic: No focal deficits.   Lab Results  Component Value Date   CREATININE 0.79 01/13/2021   BUN 15 01/13/2021   NA 138 01/13/2021   K 4.6 01/13/2021   CL 101 01/13/2021   CO2 16 (L) 01/13/2021   Lab Results  Component Value Date   ALT 19 01/13/2021   AST 18 01/13/2021   ALKPHOS 115 01/13/2021   BILITOT 0.3 01/13/2021   Lab Results  Component Value Date   HGBA1C 5.6 01/13/2021   HGBA1C 5.7 12/03/2020   Lab Results  Component Value Date   INSULIN 43.9 (H) 01/13/2021   Lab Results  Component Value Date   TSH 1.540 01/13/2021   Lab Results  Component Value Date   CHOL 206 (H) 01/13/2021   HDL 59 01/13/2021   LDLCALC 134 (H) 01/13/2021   TRIG 72 01/13/2021   Lab Results  Component Value Date   VD25OH 25.8 (L) 01/13/2021   VD25OH 28.9 12/03/2020   Lab Results  Component Value Date   WBC 12.0 (H) 01/13/2021   HGB 14.4 01/13/2021   HCT 44.5 01/13/2021   MCV 82 01/13/2021   PLT 339 01/13/2021   Attestation Statements:   Reviewed by clinician on day of visit: allergies, medications, problem list, medical history, surgical history, family history, social history, and previous encounter notes.  I, 13/10/2020, CMA, am acting as transcriptionist for Insurance claims handler, DO  I have reviewed the above documentation for accuracy and completeness, and I agree with the above. -  Helane Rima, DO, MS, FAAFP, DABOM - Family and  Bariatric Medicine.

## 2021-02-16 ENCOUNTER — Encounter (INDEPENDENT_AMBULATORY_CARE_PROVIDER_SITE_OTHER): Payer: Self-pay

## 2021-02-16 ENCOUNTER — Telehealth (INDEPENDENT_AMBULATORY_CARE_PROVIDER_SITE_OTHER): Payer: Self-pay | Admitting: Family Medicine

## 2021-02-16 NOTE — Telephone Encounter (Signed)
Prior authorization denied for Victoza, patient does not have type 2 diabetes. Patient sent mychart message.

## 2021-02-22 ENCOUNTER — Encounter (INDEPENDENT_AMBULATORY_CARE_PROVIDER_SITE_OTHER): Payer: Self-pay | Admitting: Family Medicine

## 2021-02-22 DIAGNOSIS — F39 Unspecified mood [affective] disorder: Secondary | ICD-10-CM

## 2021-03-12 ENCOUNTER — Encounter (INDEPENDENT_AMBULATORY_CARE_PROVIDER_SITE_OTHER): Payer: Self-pay | Admitting: Family Medicine

## 2021-03-12 MED ORDER — TOPIRAMATE ER 25 MG PO CAP24: 25.0000 mg | ORAL_CAPSULE | Freq: Every day | ORAL | 0 refills | Status: DC

## 2021-03-18 ENCOUNTER — Telehealth (INDEPENDENT_AMBULATORY_CARE_PROVIDER_SITE_OTHER): Payer: Self-pay

## 2021-03-18 ENCOUNTER — Encounter (INDEPENDENT_AMBULATORY_CARE_PROVIDER_SITE_OTHER): Payer: Self-pay

## 2021-03-18 NOTE — Telephone Encounter (Signed)
The request for coverage for Topiramate Cap Er 25mg , use as directed (30 per month), is denied. This decision is based on health plan criteria for Topiramate Er. This medicine is covered only if: All of the following: (1) Your doctor submits medical records documenting that you have had an inadequate response, failed, or cannot use one of the following (date and duration of trial must be provided): (A) Topiramate. (B) Topamax. (2) Your doctor provides an explanation for how the requested medication will work for you if you have failed on the covered therapeutically equivalent product which has the same active ingredient (for example, you had an adverse reaction to an inactive ingredient in the alternative product). The information provided does not show that you meet the criteria listed above.

## 2021-03-26 ENCOUNTER — Ambulatory Visit (INDEPENDENT_AMBULATORY_CARE_PROVIDER_SITE_OTHER): Payer: 59 | Admitting: Family Medicine

## 2021-03-26 ENCOUNTER — Encounter (INDEPENDENT_AMBULATORY_CARE_PROVIDER_SITE_OTHER): Payer: Self-pay

## 2021-03-30 ENCOUNTER — Encounter (INDEPENDENT_AMBULATORY_CARE_PROVIDER_SITE_OTHER): Payer: Self-pay

## 2021-03-31 ENCOUNTER — Encounter (INDEPENDENT_AMBULATORY_CARE_PROVIDER_SITE_OTHER): Payer: Self-pay

## 2021-04-22 ENCOUNTER — Encounter (INDEPENDENT_AMBULATORY_CARE_PROVIDER_SITE_OTHER): Payer: Self-pay | Admitting: Family Medicine

## 2021-04-22 ENCOUNTER — Ambulatory Visit (INDEPENDENT_AMBULATORY_CARE_PROVIDER_SITE_OTHER): Payer: 59 | Admitting: Family Medicine

## 2021-04-22 ENCOUNTER — Other Ambulatory Visit: Payer: Self-pay

## 2021-04-22 VITALS — BP 134/87 | HR 81 | Temp 98.0°F | Ht 64.0 in | Wt 304.0 lb

## 2021-04-22 DIAGNOSIS — E559 Vitamin D deficiency, unspecified: Secondary | ICD-10-CM | POA: Diagnosis not present

## 2021-04-22 DIAGNOSIS — F39 Unspecified mood [affective] disorder: Secondary | ICD-10-CM

## 2021-04-22 DIAGNOSIS — F5089 Other specified eating disorder: Secondary | ICD-10-CM

## 2021-04-22 DIAGNOSIS — Z6841 Body Mass Index (BMI) 40.0 and over, adult: Secondary | ICD-10-CM

## 2021-04-22 DIAGNOSIS — I10 Essential (primary) hypertension: Secondary | ICD-10-CM | POA: Diagnosis not present

## 2021-04-22 DIAGNOSIS — E669 Obesity, unspecified: Secondary | ICD-10-CM

## 2021-04-23 MED ORDER — TOPIRAMATE ER 25 MG PO CAP24: 25.0000 mg | ORAL_CAPSULE | Freq: Every day | ORAL | 0 refills | Status: DC

## 2021-04-23 MED ORDER — PHENTERMINE HCL 37.5 MG PO TABS: ORAL_TABLET | ORAL | 0 refills | Status: DC

## 2021-04-23 MED ORDER — BUPROPION HCL ER (SR) 150 MG PO TB12: 150.0000 mg | ORAL_TABLET | Freq: Every day | ORAL | 1 refills | Status: DC

## 2021-04-23 NOTE — Progress Notes (Signed)
Chief Complaint:   OBESITY [name] is here to discuss her progress with her obesity treatment plan along with follow-up of her obesity related diagnoses. See Medical Weight Management Flowsheet for complete bioelectrical impedance results.  Today's visit was #: 4 Starting weight: 304 lbs Starting date: 02/11/2021 Weight change since last visit: 0 Total lbs lost to date: 0  Nutrition Plan: Keeping a food journal and adhering to recommended goals of 1500 calories and 90 grams of protein daily for 30-40% of the time. Activity: None. Anti-obesity medications: Ozempic 0.25 mg subcutaneously weekly. Reported side effects: None.  Interim History: [name] has had 3 doses of Ozempic 0.25 mg subcutaneously weekly.  She says she is willing to pay for [facility] out of pocket if necessary.  Assessment/Plan:   1. Essential hypertension At goal. Medications: Norvasc 10 mg daily, valsartan 80 mg twice daily.   Plan: Avoid buying foods that are: processed, frozen, or prepackaged to avoid excess salt. We will watch for signs of hypotension as she continues lifestyle modifications.  BP Readings from Last 3 Encounters:  04/22/21 134/87  02/11/21 137/84  01/27/21 135/86   Lab Results  Component Value Date   CREATININE 0.79 01/13/2021   2. Vitamin D insufficiency Not at goal. She is taking vitamin D 50,000 IU weekly.  Plan: Continue to take prescription Vitamin D @50 ,000 IU every week as prescribed.  Follow-up for routine testing of Vitamin D, at least 2-3 times per year to avoid over-replacement.  Lab Results  Component Value Date   VD25OH 25.8 (L) 01/13/2021   VD25OH 28.9 12/03/2020   3. Other disorder of eating [name] is taking Wellbutrin 150 mg daily and Trokendi 25 mg daily.  Plan:  Continue Wellbutrin and Trokendi.  Will refill both today, as per below.  Start phentermine 18.75 mg daily.  - Refill buPROPion (WELLBUTRIN SR) 150 MG 12 hr tablet; Take 1 tablet (150 mg total) by  mouth daily.  Dispense: 90 tablet; Refill: 1 - Start phentermine (ADIPEX-P) 37.5 MG tablet; 1/2 tab po q am  Dispense: 15 tablet; Refill: 0 - Refill Topiramate ER (TROKENDI XR) 25 MG CP24; Take 1 capsule (25 mg total) by mouth daily.  Dispense: 30 capsule; Refill: 0  We reviewed potential side effects including insomnia, dry mouth, increased heart rate and blood pressure, and increased anxiety. We reviewed reducing caffeine consumption while taking phentermine, especially if the patient is experiencing side effects. Alternative treatment options were discussed. All questions were answered, and the patient wishes to move forward with this medication.  I have consulted the [place] Controlled Substances Registry for this patient, and feel the risk/benefit ratio today is favorable for proceeding with this prescription for a controlled substance. The patient understands monitoring parameters and red flags.   4. Mood disorder (HCC) [name] has anxiety and OCD. She is taking Zoloft 150 mg daily.  Plan:  Continue Zoloft 150 mg daily.   5. Obesity, current BMI 52.2  Course: [name] is currently in the action stage of change. As such, her goal is to continue with weight loss efforts.   Nutrition goals: She has agreed to keeping a food journal and adhering to recommended goals of 1500 calories and 90 grams of protein.   Exercise goals: For substantial health benefits, adults should do at least 150 minutes (2 hours and 30 minutes) a week of moderate-intensity, or 75 minutes (1 hour and 15 minutes) a week of vigorous-intensity aerobic physical activity, or an equivalent combination of moderate- and vigorous-intensity  aerobic activity. Aerobic activity should be performed in episodes of at least 10 minutes, and preferably, it should be spread throughout the week.  Behavioral modification strategies: increasing lean protein intake, decreasing simple carbohydrates, increasing vegetables, increasing water intake, and  decreasing liquid calories.  [name] has agreed to follow-up with our clinic in 4 weeks. She was informed of the importance of frequent follow-up visits to maximize her success with intensive lifestyle modifications for her multiple health conditions.   Objective:   Blood pressure 134/87, pulse 81, temperature 98 F (36.7 C), temperature source Oral, height 5\' 4"  (1.626 m), weight (!) 304 lb (137.9 kg), SpO2 95 %. Body mass index is 52.18 kg/m.  General: Cooperative, alert, well developed, in no acute distress. HEENT: Conjunctivae and lids unremarkable. Cardiovascular: Regular rhythm.  Lungs: Normal work of breathing. Neurologic: No focal deficits.   Lab Results  Component Value Date   CREATININE 0.79 01/13/2021   BUN 15 01/13/2021   NA 138 01/13/2021   K 4.6 01/13/2021   CL 101 01/13/2021   CO2 16 (L) 01/13/2021   Lab Results  Component Value Date   ALT 19 01/13/2021   AST 18 01/13/2021   ALKPHOS 115 01/13/2021   BILITOT 0.3 01/13/2021   Lab Results  Component Value Date   HGBA1C 5.6 01/13/2021   HGBA1C 5.7 12/03/2020   Lab Results  Component Value Date   INSULIN 43.9 (H) 01/13/2021   Lab Results  Component Value Date   TSH 1.540 01/13/2021   Lab Results  Component Value Date   CHOL 206 (H) 01/13/2021   HDL 59 01/13/2021   LDLCALC 134 (H) 01/13/2021   TRIG 72 01/13/2021   Lab Results  Component Value Date   VD25OH 25.8 (L) 01/13/2021   VD25OH 28.9 12/03/2020   Lab Results  Component Value Date   WBC 12.0 (H) 01/13/2021   HGB 14.4 01/13/2021   HCT 44.5 01/13/2021   MCV 82 01/13/2021   PLT 339 01/13/2021   Attestation Statements:   Reviewed by clinician on day of visit: allergies, medications, problem list, medical history, surgical history, family history, social history, and previous encounter notes.  I, 13/10/2020, CMA, am acting as transcriptionist for Insurance claims handler, DO  I have reviewed the above documentation for accuracy and completeness,  and I agree with the above. -  Helane Rima, DO, MS, FAAFP, DABOM - Family and Bariatric Medicine.

## 2021-04-28 ENCOUNTER — Encounter (INDEPENDENT_AMBULATORY_CARE_PROVIDER_SITE_OTHER): Payer: Self-pay

## 2021-04-28 DIAGNOSIS — R7301 Impaired fasting glucose: Secondary | ICD-10-CM

## 2021-04-28 MED ORDER — TIRZEPATIDE 5 MG/0.5ML ~~LOC~~ SOAJ: 5.0000 mg | SUBCUTANEOUS | 0 refills | Status: DC

## 2021-04-29 ENCOUNTER — Encounter (INDEPENDENT_AMBULATORY_CARE_PROVIDER_SITE_OTHER): Payer: Self-pay

## 2021-05-27 ENCOUNTER — Other Ambulatory Visit: Payer: Self-pay

## 2021-05-27 ENCOUNTER — Encounter (INDEPENDENT_AMBULATORY_CARE_PROVIDER_SITE_OTHER): Payer: Self-pay | Admitting: Family Medicine

## 2021-05-27 ENCOUNTER — Ambulatory Visit (INDEPENDENT_AMBULATORY_CARE_PROVIDER_SITE_OTHER): Payer: 59 | Admitting: Family Medicine

## 2021-05-27 VITALS — BP 113/74 | HR 81 | Temp 98.2°F | Ht 64.0 in | Wt 301.0 lb

## 2021-05-27 DIAGNOSIS — R7301 Impaired fasting glucose: Secondary | ICD-10-CM | POA: Diagnosis not present

## 2021-05-27 DIAGNOSIS — F5089 Other specified eating disorder: Secondary | ICD-10-CM | POA: Diagnosis not present

## 2021-05-27 DIAGNOSIS — I10 Essential (primary) hypertension: Secondary | ICD-10-CM | POA: Diagnosis not present

## 2021-05-27 DIAGNOSIS — E669 Obesity, unspecified: Secondary | ICD-10-CM

## 2021-05-27 DIAGNOSIS — Z6841 Body Mass Index (BMI) 40.0 and over, adult: Secondary | ICD-10-CM

## 2021-05-27 MED ORDER — TIRZEPATIDE 7.5 MG/0.5ML ~~LOC~~ SOAJ: 7.5000 mg | SUBCUTANEOUS | 1 refills | Status: DC

## 2021-05-27 MED ORDER — TIRZEPATIDE 5 MG/0.5ML ~~LOC~~ SOAJ: 5.0000 mg | SUBCUTANEOUS | 1 refills | Status: DC

## 2021-05-27 MED ORDER — TIRZEPATIDE 10 MG/0.5ML ~~LOC~~ SOAJ: 10.0000 mg | SUBCUTANEOUS | 1 refills | Status: AC

## 2021-05-28 ENCOUNTER — Encounter (INDEPENDENT_AMBULATORY_CARE_PROVIDER_SITE_OTHER): Payer: Self-pay

## 2021-05-28 ENCOUNTER — Encounter (INDEPENDENT_AMBULATORY_CARE_PROVIDER_SITE_OTHER): Payer: Self-pay | Admitting: Family Medicine

## 2021-06-05 NOTE — Progress Notes (Signed)
Chief Complaint:   OBESITY [name] is here to discuss her progress with her obesity treatment plan along with follow-up of her obesity related diagnoses. See Medical Weight Management Flowsheet for complete bioelectrical impedance results.  Today's visit was #: 5 Starting weight: 304 lbs Starting date: 02/11/2021 Weight change since last visit: 3 lbs Total lbs lost to date: 3 lbs Total weight loss percentage to date: 0.99%  Nutrition Plan: Keeping a food journal and adhering to recommended goals of 1500  calories and 90 grams of protein daily for 60% of the time. Activity: Increased walking. Anti-obesity medications: Mounjaro 5 mg subcutaneously weekly. Reported side effects: None.  Interim History: [name] says she was able to get [facility].  She did not pick up Wellbutrin, phentermine, or Trokendi.  Assessment/Plan:   1. Impaired fasting glucose [name] is currently on Mounjaro 5 mg subcutaneously weekly.  Plan:  Will refill Mounjaro 5 mg and also send in Mounjaro 7.5 mg and 10 mg for step therapy.  - Refill tirzepatide (MOUNJARO) 5 MG/0.5ML Pen; Inject 5 mg into the skin once a week.  Dispense: 2 mL; Refill: 1 - tirzepatide (MOUNJARO) 7.5 MG/0.5ML Pen; Inject 7.5 mg into the skin once a week.  Dispense: 2 mL; Refill: 1 - tirzepatide (MOUNJARO) 10 MG/0.5ML Pen; Inject 10 mg into the skin once a week.  Dispense: 2 mL; Refill: 1  2. Essential hypertension At goal. Medications: Norvasc 10 mg daily, valsartan 80 mg twice daily.    Plan: Avoid buying foods that are: processed, frozen, or prepackaged to avoid excess salt. We will watch for signs of hypotension as she continues lifestyle modifications  BP Readings from Last 3 Encounters:  05/27/21 113/74  04/22/21 134/87  02/11/21 137/84   Lab Results  Component Value Date   CREATININE 0.79 01/13/2021   3. Emotional eating tendencies Uncontrolled.  Medication: Mounjaro.  Plan: Discussed cues and consequences, how  thoughts affect eating, model of thoughts, feelings, and behaviors, and strategies for change by focusing on the cue. Discussed cognitive distortions, coping thoughts, and how to change your thoughts.  4. Obesity, current BMI 51.7  Course: [name] is currently in the action stage of change. As such, her goal is to continue with weight loss efforts.   Nutrition goals: She has agreed to keeping a food journal and adhering to recommended goals of 1500 calories and 90 grams of protein.   Exercise goals:  As is.  Behavioral modification strategies: increasing lean protein intake, decreasing simple carbohydrates, increasing vegetables, and increasing water intake.  [name] has agreed to follow-up with our clinic in 4 weeks. She was informed of the importance of frequent follow-up visits to maximize her success with intensive lifestyle modifications for her multiple health conditions.   Objective:   Blood pressure 113/74, pulse 81, temperature 98.2 F (36.8 C), temperature source Oral, height 5\' 4"  (1.626 m), weight (!) 301 lb (136.5 kg), SpO2 97 %. Body mass index is 51.67 kg/m.  General: Cooperative, alert, well developed, in no acute distress. HEENT: Conjunctivae and lids unremarkable. Cardiovascular: Regular rhythm.  Lungs: Normal work of breathing. Neurologic: No focal deficits.   Lab Results  Component Value Date   CREATININE 0.79 01/13/2021   BUN 15 01/13/2021   NA 138 01/13/2021   K 4.6 01/13/2021   CL 101 01/13/2021   CO2 16 (L) 01/13/2021   Lab Results  Component Value Date   ALT 19 01/13/2021   AST 18 01/13/2021   ALKPHOS 115 01/13/2021  BILITOT 0.3 01/13/2021   Lab Results  Component Value Date   HGBA1C 5.6 01/13/2021   HGBA1C 5.7 12/03/2020   Lab Results  Component Value Date   INSULIN 43.9 (H) 01/13/2021   Lab Results  Component Value Date   TSH 1.540 01/13/2021   Lab Results  Component Value Date   CHOL 206 (H) 01/13/2021   HDL 59 01/13/2021    LDLCALC 134 (H) 01/13/2021   TRIG 72 01/13/2021   Lab Results  Component Value Date   VD25OH 25.8 (L) 01/13/2021   VD25OH 28.9 12/03/2020   Lab Results  Component Value Date   WBC 12.0 (H) 01/13/2021   HGB 14.4 01/13/2021   HCT 44.5 01/13/2021   MCV 82 01/13/2021   PLT 339 01/13/2021   Attestation Statements:   Reviewed by clinician on day of visit: allergies, medications, problem list, medical history, surgical history, family history, social history, and previous encounter notes.  I, Insurance claims handler, CMA, am acting as transcriptionist for Helane Rima, DO  I have reviewed the above documentation for accuracy and completeness, and I agree with the above. -  Helane Rima, DO, MS, FAAFP, DABOM - Family and Bariatric Medicine.

## 2021-06-30 ENCOUNTER — Telehealth (INDEPENDENT_AMBULATORY_CARE_PROVIDER_SITE_OTHER): Payer: Self-pay | Admitting: Family Medicine

## 2021-06-30 ENCOUNTER — Encounter (INDEPENDENT_AMBULATORY_CARE_PROVIDER_SITE_OTHER): Payer: Self-pay

## 2021-06-30 NOTE — Telephone Encounter (Signed)
Prior authorization denied for [facility]. Patient uses copay card/self pay. Patient sent denial message via mychart.  ?

## 2021-08-20 NOTE — Progress Notes (Deleted)
[facility] [address] [place] [address] Phone: [phone] Subjective:    I'm seeing this patient by the request  of:  Lewis Moccasin, MD  CC: knee and ankle pain   [id number]  [name] is a 46 y.o. female coming in with complaint of knee and ankle pain. Patient states       Past Medical History:  Diagnosis Date   Ankle pain    Anxiety    Bilateral swelling of feet and ankles    Compartment syndrome (HCC)    Depression    Dizziness    GERD (gastroesophageal reflux disease)    Hypertension    Knee pain    OCD (obsessive compulsive disorder)    Other fatigue    Pneumonia due to COVID-19 virus    Shortness of breath    Shortness of breath on exertion    Vitamin D deficiency    Past Surgical History:  Procedure Laterality Date   CESAREAN SECTION N/A 04/24/1997   another c-section on 03/24/2002   DILATION AND CURETTAGE OF UTERUS N/A 09/1998   Social History   Socioeconomic History   Marital status: Married    Spouse name: Mellody Dance   Number of children: 2   Years of education: Not on file   Highest education level: Not on file  Occupational History   Occupation: Personal assistant  Tobacco Use   Smoking status: Never   Smokeless tobacco: Never  Vaping Use   Vaping Use: Never used  Substance and Sexual Activity   Alcohol use: Not Currently   Drug use: Never   Sexual activity: Yes    Partners: Male  Other Topics Concern   Not on file  Social History Narrative   Not on file   Social Determinants of Health   Financial Resource Strain: Not on file  Food Insecurity: Not on file  Transportation Needs: Not on file  Physical Activity: Not on file  Stress: Not on file  Social Connections: Not on file   Not on File Family History  Problem Relation Age of Onset   Cancer Mother    Hypertension Mother    Diabetes Mother    Cancer Father    Hypertension Father     Current Outpatient Medications  (Endocrine & Metabolic):    tirzepatide (MOUNJARO) 10 MG/0.5ML Pen, Inject 10 mg into the skin once a week.   tirzepatide [facility]) 5 MG/0.5ML Pen, Inject 5 mg into the skin once a week.   tirzepatide (MOUNJARO) 7.5 MG/0.5ML Pen, Inject 7.5 mg into the skin once a week.  Current Outpatient Medications (Cardiovascular):    amLODipine (NORVASC) 10 MG tablet, Take 10 mg by mouth daily.   valsartan (DIOVAN) 80 MG tablet, Take 80 mg by mouth 2 (two) times daily.     Current Outpatient Medications (Other):    buPROPion (WELLBUTRIN SR) 150 MG 12 hr tablet, Take 1 tablet (150 mg total) by mouth daily. (Patient not taking: Reported on 05/27/2021)   phentermine (ADIPEX-P) 37.5 MG tablet, 1/2 tab po q am (Patient not taking: Reported on 05/27/2021)   sertraline (ZOLOFT) 100 MG tablet, Take 150 mg by mouth daily. Take one and a half tablets (150mg  total) by mouth once daily.   Topiramate ER (TROKENDI XR) 25 MG CP24, Take 1 capsule (25 mg total) by mouth daily. (Patient not taking: Reported on 05/27/2021)   Vitamin D, Ergocalciferol, (DRISDOL) 1.25 MG (50000 UNIT) CAPS capsule, Take 1 capsule (50,000 Units  total) by mouth every 7 (seven) days.   Reviewed prior external information including notes and imaging from  primary care provider As well as notes that were available from care everywhere and other healthcare systems.  Past medical history, social, surgical and family history all reviewed in electronic medical record.  No pertanent information unless stated regarding to the chief complaint.   Review of Systems:  No headache, visual changes, nausea, vomiting, diarrhea, constipation, dizziness, abdominal pain, skin rash, fevers, chills, night sweats, weight loss, swollen lymph nodes, body aches, joint swelling, chest pain, shortness of breath, mood changes. POSITIVE muscle aches  Objective  There were no vitals taken for this visit.   General: No apparent distress alert and oriented x3 mood and  affect normal, dressed appropriately.  HEENT: Pupils equal, extraocular movements intact  Respiratory: Patient's speak in full sentences and does not appear short of breath  Cardiovascular: No lower extremity edema, non tender, no erythema      Impression and Recommendations:    The above documentation has been reviewed and is accurate and complete Judi Saa, DO

## 2021-08-24 ENCOUNTER — Ambulatory Visit (INDEPENDENT_AMBULATORY_CARE_PROVIDER_SITE_OTHER): Payer: 59 | Admitting: Family Medicine

## 2021-08-24 ENCOUNTER — Ambulatory Visit: Payer: Self-pay

## 2021-08-24 ENCOUNTER — Ambulatory Visit: Payer: Self-pay | Admitting: Family Medicine

## 2021-08-24 ENCOUNTER — Ambulatory Visit (INDEPENDENT_AMBULATORY_CARE_PROVIDER_SITE_OTHER): Payer: 59

## 2021-08-24 VITALS — BP 120/86 | HR 96 | Ht 64.0 in | Wt 292.0 lb

## 2021-08-24 DIAGNOSIS — M222X1 Patellofemoral disorders, right knee: Secondary | ICD-10-CM

## 2021-08-24 DIAGNOSIS — M25561 Pain in right knee: Secondary | ICD-10-CM

## 2021-08-24 DIAGNOSIS — M79661 Pain in right lower leg: Secondary | ICD-10-CM

## 2021-08-24 NOTE — Assessment & Plan Note (Signed)
He is having calf pain.  Exertional compartment syndrome is within the differential.  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  Discussed posture and ergonomics otherwise.  Discussed which activities to do  Avoid, increase activity otherwise slowly.  Discussed heel lifts, recovery sandals, follow-up with me again in 6 to 8 weeks

## 2021-08-24 NOTE — Progress Notes (Signed)
[facility] [address] [place] [address] Phone: [phone] Subjective:   INadine Counts, am serving as a scribe for Dr. Antoine Primas.  I'm seeing this patient by the request  of:  Lewis Moccasin, MD  CC: knee and ankle pain   [id number]  [name] is a 46 y.o. female coming in with complaint of knee and ankle pain. Patient states bilateral ankle pain was told it was exertional compartment syndrome. Right knee pain especially when doing stairs. Burning sensation in left trap. Sit at desk for work. More right knee and ankle pain.      Past Medical History:  Diagnosis Date   Ankle pain    Anxiety    Bilateral swelling of feet and ankles    Compartment syndrome (HCC)    Depression    Dizziness    GERD (gastroesophageal reflux disease)    Hypertension    Knee pain    OCD (obsessive compulsive disorder)    Other fatigue    Pneumonia due to COVID-19 virus    Shortness of breath    Shortness of breath on exertion    Vitamin D deficiency    Past Surgical History:  Procedure Laterality Date   CESAREAN SECTION N/A 04/24/1997   another c-section on 03/24/2002   DILATION AND CURETTAGE OF UTERUS N/A 09/1998   Social History   Socioeconomic History   Marital status: Married    Spouse name: Mellody Dance   Number of children: 2   Years of education: Not on file   Highest education level: Not on file  Occupational History   Occupation: Personal assistant  Tobacco Use   Smoking status: Never   Smokeless tobacco: Never  Vaping Use   Vaping Use: Never used  Substance and Sexual Activity   Alcohol use: Not Currently   Drug use: Never   Sexual activity: Yes    Partners: Male  Other Topics Concern   Not on file  Social History Narrative   Not on file   Social Determinants of Health   Financial Resource Strain: Not on file  Food Insecurity: Not on file  Transportation Needs: Not on file  Physical Activity: Not on  file  Stress: Not on file  Social Connections: Not on file   Not on File Family History  Problem Relation Age of Onset   Cancer Mother    Hypertension Mother    Diabetes Mother    Cancer Father    Hypertension Father     Current Outpatient Medications (Endocrine & Metabolic):    tirzepatide (MOUNJARO) 10 MG/0.5ML Pen, Inject 10 mg into the skin once a week.   tirzepatide [facility]) 5 MG/0.5ML Pen, Inject 5 mg into the skin once a week.   tirzepatide (MOUNJARO) 7.5 MG/0.5ML Pen, Inject 7.5 mg into the skin once a week.  Current Outpatient Medications (Cardiovascular):    amLODipine (NORVASC) 10 MG tablet, Take 10 mg by mouth daily.   valsartan (DIOVAN) 80 MG tablet, Take 80 mg by mouth 2 (two) times daily.     Current Outpatient Medications (Other):    buPROPion (WELLBUTRIN SR) 150 MG 12 hr tablet, Take 1 tablet (150 mg total) by mouth daily. (Patient not taking: Reported on 05/27/2021)   phentermine (ADIPEX-P) 37.5 MG tablet, 1/2 tab po q am (Patient not taking: Reported on 05/27/2021)   sertraline (ZOLOFT) 100 MG tablet, Take 150 mg by mouth daily. Take one and a half tablets (150mg  total) by  mouth once daily.   Topiramate ER (TROKENDI XR) 25 MG CP24, Take 1 capsule (25 mg total) by mouth daily. (Patient not taking: Reported on 05/27/2021)   Vitamin D, Ergocalciferol, (DRISDOL) 1.25 MG (50000 UNIT) CAPS capsule, Take 1 capsule (50,000 Units total) by mouth every 7 (seven) days.   Reviewed prior external information including notes and imaging from  primary care provider As well as notes that were available from care everywhere and other healthcare systems.  Past medical history, social, surgical and family history all reviewed in electronic medical record.  No pertanent information unless stated regarding to the chief complaint.   Review of Systems:  No headache, visual changes, nausea, vomiting, diarrhea, constipation, dizziness, abdominal pain, skin rash, fevers, chills,  night sweats, weight loss, swollen lymph nodes, body aches, joint swelling, chest pain, shortness of breath, mood changes. POSITIVE muscle aches  Objective  Blood pressure 120/86, pulse 96, height 5\' 4"  (1.626 m), weight 292 lb (132.5 kg), SpO2 98 %.   General: No apparent distress alert and oriented x3 mood and affect normal, dressed appropriately. obesity noted HEENT: Pupils equal, extraocular movements intact  Respiratory: Patient's speak in full sentences and does not appear short of breath  Cardiovascular: No lower extremity edema, non tender, no erythema  Right knee exam trace effusion of PFJ Patient also ankle does not have any significant findings but does have tightness noted of the lateral gastroc.  Limited muscular skeletal ultrasound was performed and interpreted by , M  Ultrasound of patient's ankle was fairly unremarkable except for possibly a tertiary peroneal tendon noted on the anterior aspect of the lateral malleolus.  No significant swelling, no masses appreciated today. Impression: Questionable tertiary peroneal but otherwise fairly unremarkable.   [id number]; 15 additional minutes spent for Therapeutic exercises as stated in above notes.  This included exercises focusing on stretching, strengthening, with significant focus on eccentric aspects.   Long term goals include an improvement in range of motion, strength, endurance as well as avoiding reinjury. Patient's frequency would include in 1-2 times a day, 3-5 times a week for a duration of 6-12 weeks. Low back exercises that included:  Pelvic tilt/bracing instruction to focus on control of the pelvic girdle and lower abdominal muscles  Glute strengthening exercises, focusing on proper firing of the glutes without engaging the low back muscles Proper stretching techniques for maximum relief for the hamstrings, hip flexors, low back and some rotation where tolerated  Proper technique shown and discussed handout in  great detail with ATC.  All questions were discussed and answered.     Impression and Recommendations:    The above documentation has been reviewed and is accurate and complete Antoine Primas, DO

## 2021-08-24 NOTE — Assessment & Plan Note (Signed)
Patient does have some patellofemoral syndrome noted.  Discussed icing regimen and home exercises, which activities to do which ones to avoid, increase activity slowly otherwise.  Follow-up with me again in 6 to 8 weeks.  Worsening pain consider injection and formal physical therapy

## 2021-08-24 NOTE — Assessment & Plan Note (Signed)
  Patient does have morbid obesity but is waiting working on weight loss.  Do feel if patient can get her BMI under 40 she feels significantly better.

## 2021-08-24 NOTE — Patient Instructions (Addendum)
[place] tool Grass exercises (walking on heels and toes) Xrays today Do prescribed exercises at least 3x a week Vit D 2-4000iu daily Tumeric 500 grams daily See you again in 6-8 weeks

## 2021-10-01 NOTE — Progress Notes (Deleted)
[name] [address] [place] [address] Phone: [phone] Subjective:    I'm seeing this patient by the request  of:  Lewis Moccasin, MD  CC:   [id number]  08/24/2021 He is having calf pain.  Exertional compartment syndrome is within the differential.   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  Discussed posture and ergonomics otherwise.  Discussed which activities to do   Avoid, increase activity otherwise slowly.  Discussed heel lifts, recovery sandals, follow-up with me again in 6 to 8 weeks  Patient does have some patellofemoral syndrome noted.  Discussed icing regimen and home exercises, which activities to do which ones to avoid, increase activity slowly otherwise.  Follow-up with me again in 6 to 8 weeks.  Worsening pain consider injection and formal physical therapy  Patient does have morbid obesity but is waiting working on weight loss.  Do feel if patient can get her BMI under 40 she feels significantly better.  Update 10/05/2021 [name] is a 46 y.o. female coming in with complaint of R knee and R calf pain. Patient states        Past Medical History:  Diagnosis Date   Ankle pain    Anxiety    Bilateral swelling of feet and ankles    Compartment syndrome (HCC)    Depression    Dizziness    GERD (gastroesophageal reflux disease)    Hypertension    Knee pain    OCD (obsessive compulsive disorder)    Other fatigue    Pneumonia due to COVID-19 virus    Shortness of breath    Shortness of breath on exertion    Vitamin D deficiency    Past Surgical History:  Procedure Laterality Date   CESAREAN SECTION N/A 04/24/1997   another c-section on 03/24/2002   DILATION AND CURETTAGE OF UTERUS N/A 09/1998   Social History   Socioeconomic History   Marital status: Married    Spouse name: Mellody Dance    Number of children: 2   Years of education: Not on file   Highest education level: Not on file  Occupational History   Occupation: Personal assistant  Tobacco Use   Smoking status: Never   Smokeless tobacco: Never  Vaping Use   Vaping Use: Never used  Substance and Sexual Activity   Alcohol use: Not Currently   Drug use: Never   Sexual activity: Yes    Partners: Male  Other Topics Concern   Not on file  Social History Narrative   Not on file   Social Determinants of Health   Financial Resource Strain: Not on file  Food Insecurity: Not on file  Transportation Needs: Not on file  Physical Activity: Not on file  Stress: Not on file  Social Connections: Not on file   Not on File Family History  Problem Relation Age of Onset   Cancer Mother    Hypertension Mother    Diabetes Mother    Cancer Father    Hypertension Father     Current Outpatient Medications (Endocrine & Metabolic):    tirzepatide (MOUNJARO) 10 MG/0.5ML Pen, Inject 10 mg into the skin once a week.   tirzepatide [facility]) 5 MG/0.5ML Pen, Inject 5 mg into the skin once a week.   tirzepatide (MOUNJARO) 7.5 MG/0.5ML Pen, Inject 7.5 mg into the skin once a week.  Current Outpatient Medications (Cardiovascular):    amLODipine (NORVASC) 10 MG tablet, Take 10 mg by mouth daily.   valsartan (DIOVAN) 80 MG tablet, Take 80 mg by mouth 2 (two) times daily.     Current Outpatient Medications (Other):    buPROPion (WELLBUTRIN SR) 150 MG 12 hr tablet, Take 1 tablet (150 mg total) by mouth daily. (Patient not taking: Reported on 05/27/2021)   phentermine (ADIPEX-P) 37.5 MG tablet, 1/2 tab po q am (Patient not taking: Reported on 05/27/2021)   sertraline (ZOLOFT) 100 MG tablet, Take 150 mg by mouth daily. Take one and a half tablets (150mg  total) by mouth once daily.   Topiramate ER (TROKENDI XR) 25 MG CP24, Take 1 capsule (25 mg total) by mouth daily. (Patient not taking: Reported on 05/27/2021)   Vitamin D,  Ergocalciferol, (DRISDOL) 1.25 MG (50000 UNIT) CAPS capsule, Take 1 capsule (50,000 Units total) by mouth every 7 (seven) days.   Reviewed prior external information including notes and imaging from  primary care provider As well as notes that were available from care everywhere and other healthcare systems.  Past medical history, social, surgical and family history all reviewed in electronic medical record.  No pertanent information unless stated regarding to the chief complaint.   Review of Systems:  No headache, visual changes, nausea, vomiting, diarrhea, constipation, dizziness, abdominal pain, skin rash, fevers, chills, night sweats, weight loss, swollen lymph nodes, body aches, joint swelling, chest pain, shortness of breath, mood changes. POSITIVE muscle aches  Objective  There were no vitals taken for this visit.   General: No apparent distress alert and oriented x3 mood and affect normal, dressed appropriately.  HEENT: Pupils equal, extraocular movements intact  Respiratory: Patient's speak in full sentences and does not appear short of breath  Cardiovascular: No lower extremity edema, non tender, no erythema      Impression and Recommendations:

## 2021-10-05 ENCOUNTER — Ambulatory Visit: Payer: 59 | Admitting: Family Medicine

## 2021-10-14 ENCOUNTER — Encounter (INDEPENDENT_AMBULATORY_CARE_PROVIDER_SITE_OTHER): Payer: Self-pay

## 2021-12-01 ENCOUNTER — Other Ambulatory Visit: Payer: Self-pay | Admitting: Obstetrics and Gynecology

## 2021-12-01 DIAGNOSIS — Z1231 Encounter for screening mammogram for malignant neoplasm of breast: Secondary | ICD-10-CM

## 2022-07-26 ENCOUNTER — Ambulatory Visit (INDEPENDENT_AMBULATORY_CARE_PROVIDER_SITE_OTHER): Payer: 59

## 2022-07-26 ENCOUNTER — Ambulatory Visit (INDEPENDENT_AMBULATORY_CARE_PROVIDER_SITE_OTHER): Payer: 59 | Admitting: Sports Medicine

## 2022-07-26 VITALS — HR 111 | Ht 64.0 in | Wt 284.0 lb

## 2022-07-26 DIAGNOSIS — M542 Cervicalgia: Secondary | ICD-10-CM | POA: Diagnosis not present

## 2022-07-26 DIAGNOSIS — M5412 Radiculopathy, cervical region: Secondary | ICD-10-CM

## 2022-07-26 MED ORDER — CYCLOBENZAPRINE HCL 5 MG PO TABS: 5.0000 mg | ORAL_TABLET | Freq: Every day | ORAL | 0 refills | Status: AC

## 2022-07-26 MED ORDER — MELOXICAM 15 MG PO TABS: 15.0000 mg | ORAL_TABLET | Freq: Every day | ORAL | 0 refills | Status: AC

## 2022-07-26 NOTE — Patient Instructions (Addendum)
Good to see you  Neck HEP  - Start meloxicam 15 mg daily x2 weeks.  If still having pain after 2 weeks, complete 3rd-week of meloxicam. May use remaining meloxicam as needed once daily for pain control.  Do not to use additional NSAIDs while taking meloxicam.  May use Tylenol [phone] mg 2 to 3 times a day for breakthrough pain. Flexeril 5-10 mg nightly as needed for muscle spasm  3 week follow up

## 2022-07-26 NOTE — Progress Notes (Signed)
[name] [address] [place] [address] Phone: [phone]   Assessment and Plan:     1. Neck pain 2. Cervical radiculopathy at C5 3. C6 radiculopathy - Acute, initial sports medicine visit - X-ray obtained in clinic.  My interpretation: No acute fracture or vertebral collapse. Inferior anterior spurring of the vertebral body at C5 -Suspect C5-6 radiculopathy.  Unclear if muscular strain is causing irritation to these nerves or if they are irritated more essentially from herniated disc or other central pathology - No weakness on physical exam and no red flag signs, so we will proceed with conservative therapy - Start meloxicam 15 mg daily x2 weeks.  If still having pain after 2 weeks, complete 3rd-week of meloxicam. May use remaining meloxicam as needed once daily for pain control.  Do not to use additional NSAIDs while taking meloxicam.  May use Tylenol [phone] mg 2 to 3 times a day for breakthrough pain. - Start Flexeril 5 to 10 mg nightly as needed for muscle spasms - Start HEP for neck  Pertinent previous records reviewed include none   Follow Up: 3 weeks for reevaluation.  Could consider OMT versus physical therapy versus advanced imaging based on symptoms   Subjective:   I, [name], am serving as a Neurosurgeon for Doctor Richardean Sale  Chief Complaint: neck pain   HPI:   07/26/22 Patient is a 47 year old female complaining of neck pain. Patient states that she woke up Friday and had a crick in her neck when she would bend down she would have pain go dow to her right hand, does endorses right thumb numbness, when she lifts her right arm she feels a pull, pain feels like a pinched nerve, she has weakness down her right arm , she states it feels funny , she is not able to sleep through the night , tylenol and ibu along with motrin and that doesn't help much , she is not able to get a good deep breath but she  thinks that might be nerves  Relevant Historical Information: Hypertension  Additional pertinent review of systems negative.   Current Outpatient Medications:    amLODipine (NORVASC) 10 MG tablet, Take 10 mg by mouth daily., Disp: , Rfl:    cyclobenzaprine (FLEXERIL) 5 MG tablet, Take 1 tablet (5 mg total) by mouth at bedtime., Disp: 30 tablet, Rfl: 0   meloxicam (MOBIC) 15 MG tablet, Take 1 tablet (15 mg total) by mouth daily., Disp: 30 tablet, Rfl: 0   sertraline (ZOLOFT) 100 MG tablet, Take 150 mg by mouth daily. Take one and a half tablets (150mg  total) by mouth once daily., Disp: , Rfl:    tirzepatide (MOUNJARO) 10 MG/0.5ML Pen, Inject 10 mg into the skin once a week., Disp: 2 mL, Rfl: 1   valsartan (DIOVAN) 80 MG tablet, Take 80 mg by mouth 2 (two) times daily., Disp: , Rfl:    Objective:     Vitals:   07/26/22 1434  Pulse: (!) 111  SpO2: 98%  Weight: 284 lb (128.8 kg)  Height: 5\' 4"  (1.626 m)      Body mass index is 48.75 kg/m.    Physical Exam:    Neck Exam: Cervical Spine- Posture normal Skin- normal, intact  Neuro:  Strength-  Right Left   Deltoid (C5) 5/5 5/5  Bicep/Brachioradialis (C5/6) 5/5  5/5  Wrist Extension (C6) 5/5 5/5  Tricep (C7) 5/5 5/5  Wrist Flexion (C7) 5/5  5/5  Grip (C8) 5/5 5/5  Finger Abduction (T1) 5/5 5/5   Sensation: Decreased sensation over right lateral upper arm, right lateral forearm, first and second digit comparatively to left.  Otherwise intact to light touch in upper extremities bilaterally  Spurling's:  negative bilaterally Neck ROM: Full active ROM TTP: Right cervical paraspinal, right trapezius NTTP: cervical spinous processes, left cervical paraspinal, thoracic paraspinal, left trapezius    Electronically signed by:  [name] 3:08 PM 07/26/22

## 2022-07-29 ENCOUNTER — Encounter: Payer: Self-pay | Admitting: Sports Medicine

## 2022-08-13 NOTE — Progress Notes (Unsigned)
    [name] [address] [place] [address] Phone: [phone]   Assessment and Plan:     There are no diagnoses linked to this encounter.  ***   Pertinent previous records reviewed include ***   Follow Up: ***     Subjective:   I, [name], am serving as a Neurosurgeon for Doctor Richardean Sale   Chief Complaint: neck pain    HPI:    07/26/22 Patient is a 47 year old female complaining of neck pain. Patient states that she woke up Friday and had a crick in her neck when she would bend down she would have pain go dow to her right hand, does endorses right thumb numbness, when she lifts her right arm she feels a pull, pain feels like a pinched nerve, she has weakness down her right arm , she states it feels funny , she is not able to sleep through the night , tylenol and ibu along with motrin and that doesn't help much , she is not able to get a good deep breath but she thinks that might be nerves  08/16/2022 Patient states    Relevant Historical Information: Hypertension  Additional pertinent review of systems negative.   Current Outpatient Medications:    amLODipine (NORVASC) 10 MG tablet, Take 10 mg by mouth daily., Disp: , Rfl:    cyclobenzaprine (FLEXERIL) 5 MG tablet, Take 1 tablet (5 mg total) by mouth at bedtime., Disp: 30 tablet, Rfl: 0   meloxicam (MOBIC) 15 MG tablet, Take 1 tablet (15 mg total) by mouth daily., Disp: 30 tablet, Rfl: 0   sertraline (ZOLOFT) 100 MG tablet, Take 150 mg by mouth daily. Take one and a half tablets (150mg  total) by mouth once daily., Disp: , Rfl:    tirzepatide (MOUNJARO) 10 MG/0.5ML Pen, Inject 10 mg into the skin once a week., Disp: 2 mL, Rfl: 1   valsartan (DIOVAN) 80 MG tablet, Take 80 mg by mouth 2 (two) times daily., Disp: , Rfl:    Objective:     There were no vitals filed for this visit.    There is no height or weight on file to calculate BMI.    Physical Exam:     ***   Electronically signed by:  [name] [facility] 2:55 PM 08/13/22

## 2022-08-16 ENCOUNTER — Ambulatory Visit (INDEPENDENT_AMBULATORY_CARE_PROVIDER_SITE_OTHER): Payer: 59 | Admitting: Sports Medicine

## 2022-08-16 VITALS — HR 105 | Ht 64.0 in | Wt 267.0 lb

## 2022-08-16 DIAGNOSIS — M542 Cervicalgia: Secondary | ICD-10-CM

## 2022-08-16 DIAGNOSIS — M5412 Radiculopathy, cervical region: Secondary | ICD-10-CM

## 2022-08-16 NOTE — Patient Instructions (Signed)
Pt referral  Call us if not better in 4 weeks and we will order an MRI

## 2022-12-21 ENCOUNTER — Other Ambulatory Visit (HOSPITAL_COMMUNITY): Payer: Self-pay

## 2023-11-07 IMAGING — DX DG KNEE AP/LAT W/ SUNRISE*R*
3 series · 3 of 3 positions shown · non-contrast
Comparison: None Available.

CLINICAL DATA: Knee pain

EXAM:
RIGHT KNEE 3 VIEWS

[knee ap]
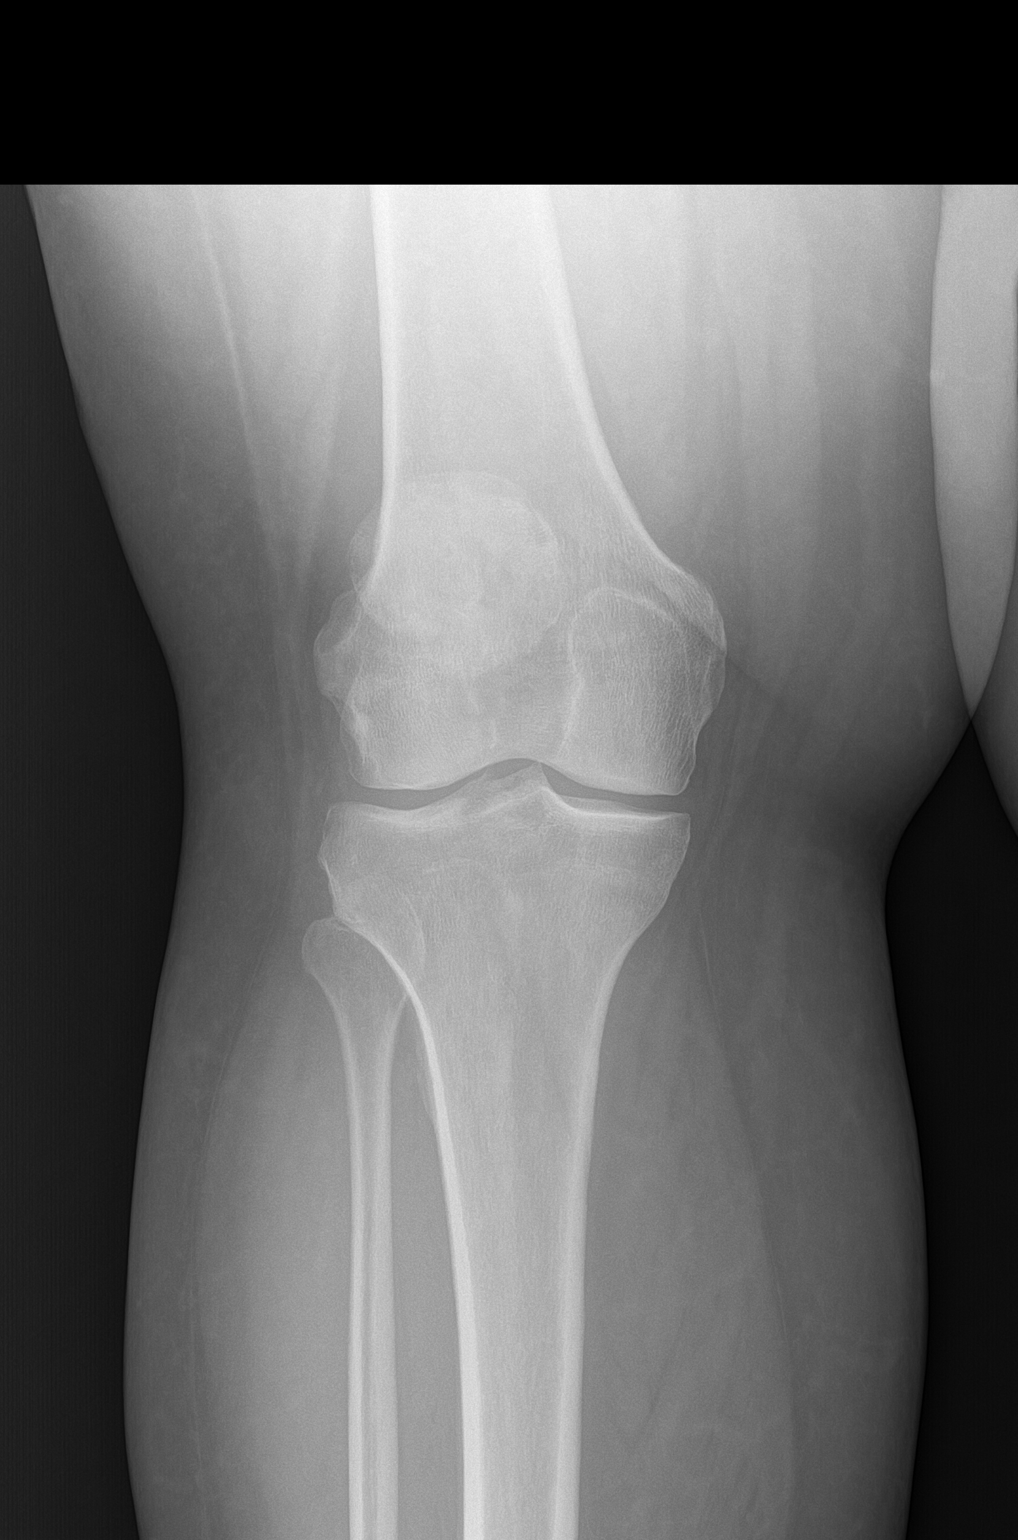

[knee lat]
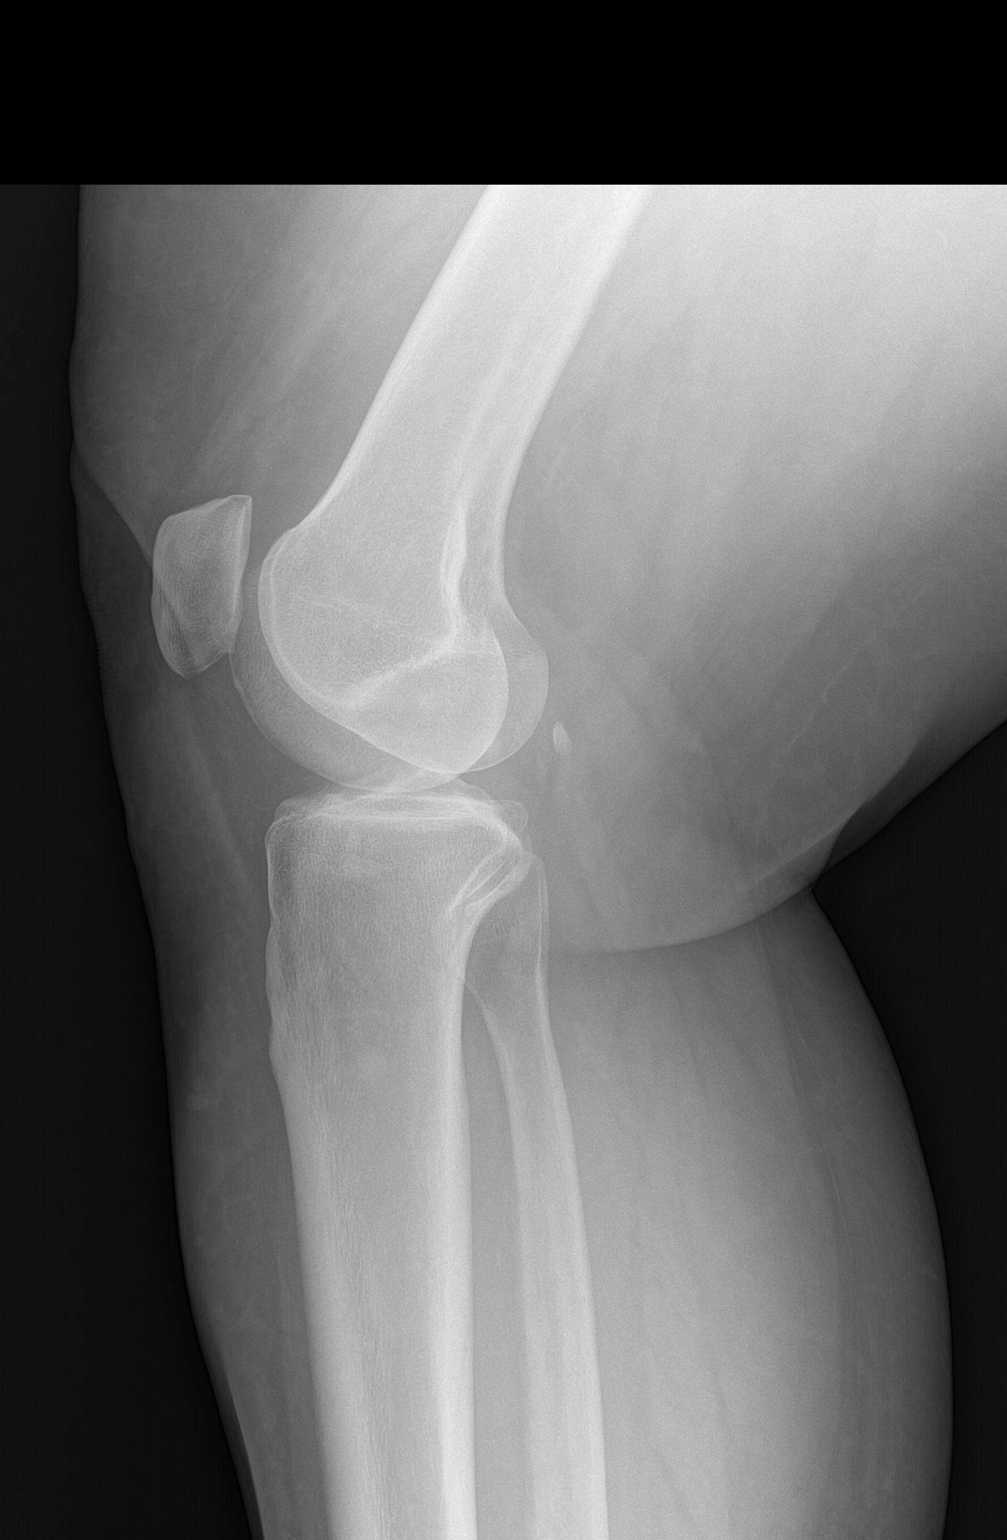

[patella]
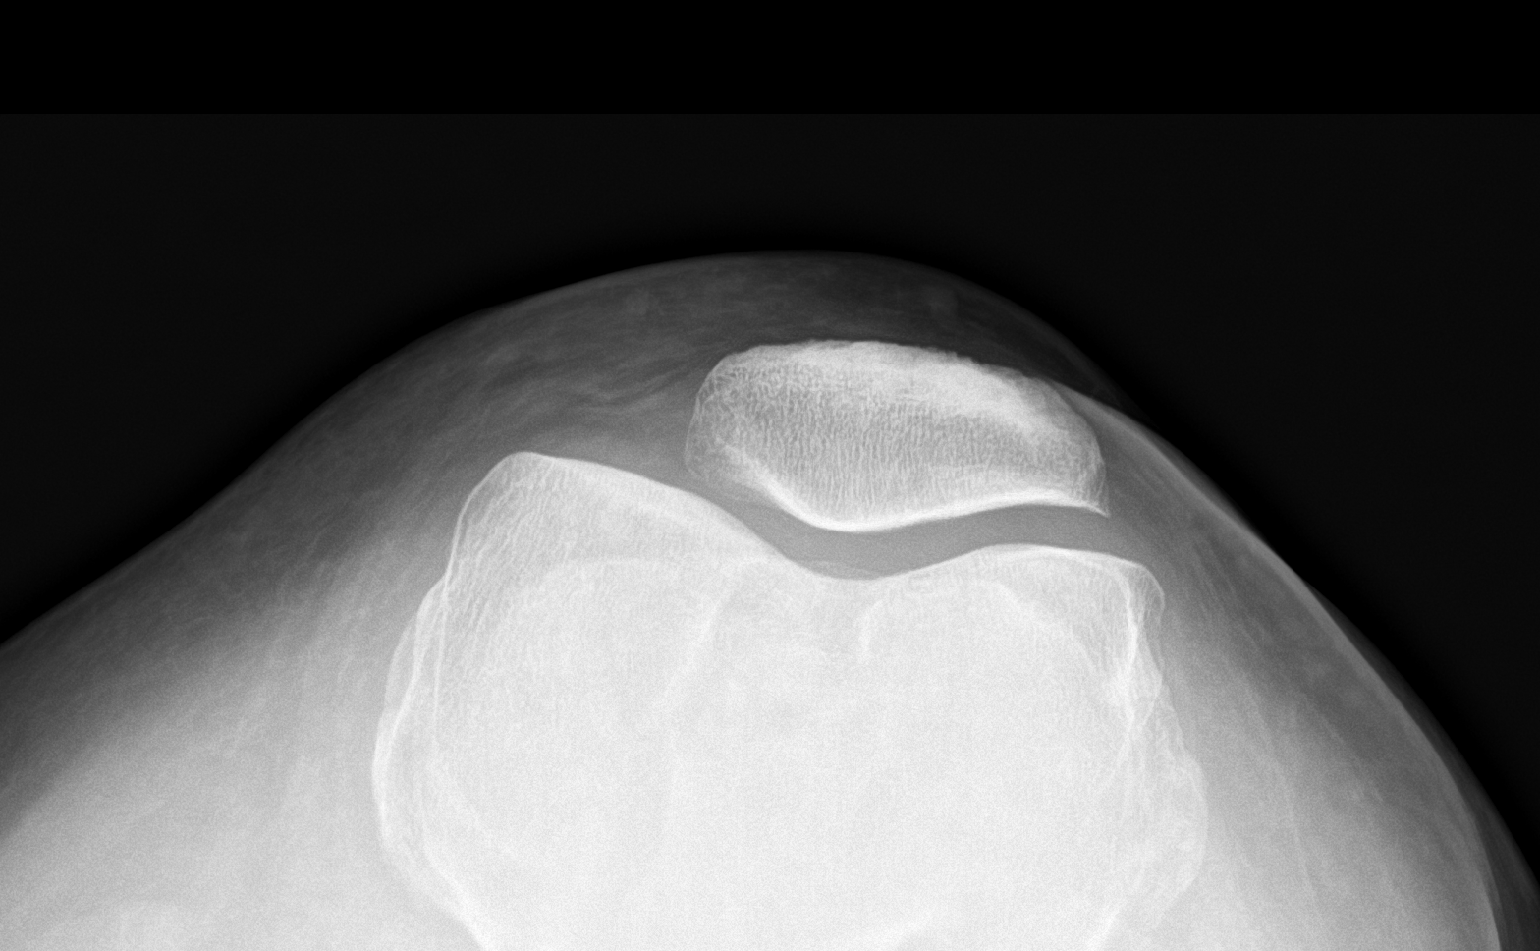

[3 of 3 positions shown; findings below may reference images not displayed]

FINDINGS: No evidence of fracture, dislocation, or joint effusion. No evidence
of arthropathy or other focal bone abnormality. Soft tissues are
unremarkable.
IMPRESSION: No acute osseous abnormality identified.

## 2024-05-17 ENCOUNTER — Ambulatory Visit: Admitting: Family Medicine
